# Patient Record
Sex: Female | Born: 1935 | Race: White | Hispanic: No
Health system: Southern US, Community
[De-identification: ages and names within clinical notes are randomized; demographics above are authoritative.]

## PROBLEM LIST (undated history)

## (undated) DIAGNOSIS — F0280 Dementia in other diseases classified elsewhere without behavioral disturbance: Secondary | ICD-10-CM

## (undated) DIAGNOSIS — M810 Age-related osteoporosis without current pathological fracture: Secondary | ICD-10-CM

## (undated) DIAGNOSIS — M069 Rheumatoid arthritis, unspecified: Secondary | ICD-10-CM

## (undated) DIAGNOSIS — I10 Essential (primary) hypertension: Secondary | ICD-10-CM

## (undated) DIAGNOSIS — L409 Psoriasis, unspecified: Secondary | ICD-10-CM

## (undated) DIAGNOSIS — G309 Alzheimer's disease, unspecified: Secondary | ICD-10-CM

## (undated) DIAGNOSIS — E785 Hyperlipidemia, unspecified: Secondary | ICD-10-CM

---

## 2004-06-23 ENCOUNTER — Ambulatory Visit: Payer: Self-pay

## 2004-07-23 ENCOUNTER — Ambulatory Visit: Payer: Self-pay | Admitting: Internal Medicine

## 2005-08-25 ENCOUNTER — Ambulatory Visit: Payer: Self-pay | Admitting: Internal Medicine

## 2008-12-18 ENCOUNTER — Ambulatory Visit: Payer: Self-pay | Admitting: Internal Medicine

## 2010-10-31 ENCOUNTER — Inpatient Hospital Stay: Payer: Self-pay | Admitting: Internal Medicine

## 2012-12-21 ENCOUNTER — Observation Stay: Payer: Self-pay | Admitting: Internal Medicine

## 2012-12-21 LAB — URINALYSIS, COMPLETE
Bacteria: NONE SEEN
Bilirubin,UR: NEGATIVE
Glucose,UR: NEGATIVE mg/dL (ref 0–75)
Ketone: NEGATIVE
Ph: 5 (ref 4.5–8.0)
RBC,UR: 3 /HPF (ref 0–5)
Specific Gravity: 1.015 (ref 1.003–1.030)
Squamous Epithelial: NONE SEEN
WBC UR: 1 /HPF (ref 0–5)

## 2012-12-21 LAB — CBC
HGB: 11.9 g/dL — ABNORMAL LOW (ref 12.0–16.0)
MCH: 30.9 pg (ref 26.0–34.0)
MCHC: 33.2 g/dL (ref 32.0–36.0)
Platelet: 141 10*3/uL — ABNORMAL LOW (ref 150–440)
RBC: 3.85 10*6/uL (ref 3.80–5.20)
WBC: 9.9 10*3/uL (ref 3.6–11.0)

## 2012-12-21 LAB — COMPREHENSIVE METABOLIC PANEL
Albumin: 3.1 g/dL — ABNORMAL LOW (ref 3.4–5.0)
BUN: 21 mg/dL — ABNORMAL HIGH (ref 7–18)
Bilirubin,Total: 0.4 mg/dL (ref 0.2–1.0)
Calcium, Total: 9 mg/dL (ref 8.5–10.1)
Chloride: 107 mmol/L (ref 98–107)
Co2: 26 mmol/L (ref 21–32)
Creatinine: 0.81 mg/dL (ref 0.60–1.30)
EGFR (African American): >60
Glucose: 102 mg/dL — ABNORMAL HIGH (ref 65–99)
Osmolality: 283 (ref 275–301)
Potassium: 3.6 mmol/L (ref 3.5–5.1)
Total Protein: 6.3 g/dL — ABNORMAL LOW (ref 6.4–8.2)

## 2012-12-21 LAB — LIPASE, BLOOD: Lipase: 209 U/L (ref 73–393)

## 2012-12-21 LAB — DIFFERENTIAL

## 2012-12-22 LAB — BASIC METABOLIC PANEL
Anion Gap: 6 — ABNORMAL LOW (ref 7–16)
BUN: 18 mg/dL (ref 7–18)
Co2: 27 mmol/L (ref 21–32)
Creatinine: 0.69 mg/dL (ref 0.60–1.30)
EGFR (African American): >60
Glucose: 93 mg/dL (ref 65–99)
Osmolality: 283 (ref 275–301)
Potassium: 3.5 mmol/L (ref 3.5–5.1)
Sodium: 141 mmol/L (ref 136–145)

## 2012-12-22 LAB — CBC WITH DIFFERENTIAL/PLATELET
Basophil #: 0 10*3/uL (ref 0.0–0.1)
Eosinophil #: 0 10*3/uL (ref 0.0–0.7)
Eosinophil %: 0.2 %
HCT: 33.2 % — ABNORMAL LOW (ref 35.0–47.0)
Lymphocyte #: 0.8 10*3/uL — ABNORMAL LOW (ref 1.0–3.6)
MCH: 31 pg (ref 26.0–34.0)
MCHC: 33 g/dL (ref 32.0–36.0)
Monocyte %: 6.7 %
Neutrophil %: 86.6 %
Platelet: 125 10*3/uL — ABNORMAL LOW (ref 150–440)
RBC: 3.54 10*6/uL — ABNORMAL LOW (ref 3.80–5.20)
WBC: 12.7 10*3/uL — ABNORMAL HIGH (ref 3.6–11.0)

## 2012-12-22 LAB — HEPATIC FUNCTION PANEL A (ARMC)
Alkaline Phosphatase: 60 U/L
Bilirubin, Direct: 0.1 mg/dL (ref 0.00–0.20)
Bilirubin,Total: 0.5 mg/dL (ref 0.2–1.0)
SGOT(AST): 17 U/L (ref 15–37)
Total Protein: 5.4 g/dL — ABNORMAL LOW (ref 6.4–8.2)

## 2012-12-23 LAB — CBC WITH DIFFERENTIAL/PLATELET
Basophil #: 0 10*3/uL (ref 0.0–0.1)
Basophil %: 0.4 %
Eosinophil #: 0 10*3/uL (ref 0.0–0.7)
Eosinophil %: 0.5 %
HGB: 10.7 g/dL — ABNORMAL LOW (ref 12.0–16.0)
Lymphocyte #: 1.1 10*3/uL (ref 1.0–3.6)
Lymphocyte %: 12.8 %
MCH: 31.9 pg (ref 26.0–34.0)
MCHC: 33.9 g/dL (ref 32.0–36.0)
Monocyte #: 0.8 x10 3/mm (ref 0.2–0.9)
Monocyte %: 8.5 %
Neutrophil #: 6.9 10*3/uL — ABNORMAL HIGH (ref 1.4–6.5)
Neutrophil %: 77.8 %
Platelet: 104 10*3/uL — ABNORMAL LOW (ref 150–440)
RBC: 3.35 10*6/uL — ABNORMAL LOW (ref 3.80–5.20)
RDW: 15 % — ABNORMAL HIGH (ref 11.5–14.5)
WBC: 8.9 10*3/uL (ref 3.6–11.0)

## 2012-12-23 LAB — URINE CULTURE

## 2012-12-26 LAB — CULTURE, BLOOD (SINGLE)

## 2013-05-11 ENCOUNTER — Emergency Department: Payer: Self-pay | Admitting: Internal Medicine

## 2013-05-11 LAB — URINALYSIS, COMPLETE
BLOOD: NEGATIVE
Bilirubin,UR: NEGATIVE
Glucose,UR: NEGATIVE mg/dL (ref 0–75)
KETONE: NEGATIVE
NITRITE: NEGATIVE
PROTEIN: NEGATIVE
Ph: 7 (ref 4.5–8.0)
RBC,UR: NONE SEEN /HPF (ref 0–5)
SPECIFIC GRAVITY: 1.014 (ref 1.003–1.030)
Squamous Epithelial: NONE SEEN
WBC UR: 5 /HPF (ref 0–5)

## 2013-05-11 LAB — COMPREHENSIVE METABOLIC PANEL
ALBUMIN: 3.3 g/dL — AB (ref 3.4–5.0)
Alkaline Phosphatase: 78 U/L
Anion Gap: 3 — ABNORMAL LOW (ref 7–16)
BUN: 12 mg/dL (ref 7–18)
Bilirubin,Total: 0.5 mg/dL (ref 0.2–1.0)
CREATININE: 0.94 mg/dL (ref 0.60–1.30)
Calcium, Total: 9.2 mg/dL (ref 8.5–10.1)
Chloride: 105 mmol/L (ref 98–107)
Co2: 32 mmol/L (ref 21–32)
EGFR (African American): >60
GFR CALC NON AF AMER: 52 — AB
Glucose: 155 mg/dL — ABNORMAL HIGH (ref 65–99)
Osmolality: 282 (ref 275–301)
Potassium: 3.5 mmol/L (ref 3.5–5.1)
SGOT(AST): 15 U/L (ref 15–37)
SGPT (ALT): 12 U/L (ref 12–78)
Sodium: 140 mmol/L (ref 136–145)
TOTAL PROTEIN: 6.8 g/dL (ref 6.4–8.2)

## 2013-05-11 LAB — CBC
HCT: 40 % (ref 35.0–47.0)
HGB: 13.2 g/dL (ref 12.0–16.0)
MCH: 29.9 pg (ref 26.0–34.0)
MCHC: 33 g/dL (ref 32.0–36.0)
MCV: 91 fL (ref 80–100)
Platelet: 167 10*3/uL (ref 150–440)
RBC: 4.42 10*6/uL (ref 3.80–5.20)
RDW: 14.5 % (ref 11.5–14.5)
WBC: 9 10*3/uL (ref 3.6–11.0)

## 2013-07-18 ENCOUNTER — Emergency Department: Payer: Self-pay | Admitting: Emergency Medicine

## 2013-08-05 ENCOUNTER — Emergency Department: Payer: Self-pay | Admitting: Emergency Medicine

## 2013-09-09 ENCOUNTER — Emergency Department: Payer: Self-pay | Admitting: Emergency Medicine

## 2013-09-13 ENCOUNTER — Emergency Department: Payer: Self-pay | Admitting: Emergency Medicine

## 2013-10-03 ENCOUNTER — Emergency Department: Payer: Self-pay | Admitting: Emergency Medicine

## 2013-10-06 ENCOUNTER — Emergency Department: Payer: Self-pay | Admitting: Emergency Medicine

## 2013-10-06 LAB — CBC
HCT: 38.4 % (ref 35.0–47.0)
HGB: 12.6 g/dL (ref 12.0–16.0)
MCH: 29 pg (ref 26.0–34.0)
MCHC: 32.9 g/dL (ref 32.0–36.0)
MCV: 88 fL (ref 80–100)
Platelet: 186 10*3/uL (ref 150–440)
RBC: 4.35 10*6/uL (ref 3.80–5.20)
RDW: 14.3 % (ref 11.5–14.5)
WBC: 9 10*3/uL (ref 3.6–11.0)

## 2013-10-06 LAB — URINALYSIS, COMPLETE
BLOOD: NEGATIVE
Bilirubin,UR: NEGATIVE
Glucose,UR: NEGATIVE mg/dL (ref 0–75)
KETONE: NEGATIVE
Nitrite: NEGATIVE
PH: 5 (ref 4.5–8.0)
PROTEIN: NEGATIVE
Specific Gravity: 1.018 (ref 1.003–1.030)
WBC UR: 88 /HPF (ref 0–5)

## 2013-10-06 LAB — BASIC METABOLIC PANEL
ANION GAP: 7 (ref 7–16)
BUN: 16 mg/dL (ref 7–18)
Calcium, Total: 8.8 mg/dL (ref 8.5–10.1)
Chloride: 104 mmol/L (ref 98–107)
Co2: 30 mmol/L (ref 21–32)
Creatinine: 0.86 mg/dL (ref 0.60–1.30)
EGFR (African American): >60
EGFR (Non-African Amer.): 58 — ABNORMAL LOW
Glucose: 129 mg/dL — ABNORMAL HIGH (ref 65–99)
OSMOLALITY: 284 (ref 275–301)
Potassium: 3.2 mmol/L — ABNORMAL LOW (ref 3.5–5.1)
Sodium: 141 mmol/L (ref 136–145)

## 2013-10-06 LAB — TROPONIN I: Troponin-I: <0.02 ng/mL

## 2013-10-10 LAB — URINE CULTURE

## 2013-10-27 ENCOUNTER — Emergency Department: Payer: Self-pay | Admitting: Student

## 2013-10-27 LAB — COMPREHENSIVE METABOLIC PANEL
ALBUMIN: 3.1 g/dL — AB (ref 3.4–5.0)
ALK PHOS: 154 U/L — AB
AST: 32 U/L (ref 15–37)
Anion Gap: 8 (ref 7–16)
BILIRUBIN TOTAL: 0.7 mg/dL (ref 0.2–1.0)
BUN: 12 mg/dL (ref 7–18)
CO2: 29 mmol/L (ref 21–32)
CREATININE: 0.65 mg/dL (ref 0.60–1.30)
Calcium, Total: 8.8 mg/dL (ref 8.5–10.1)
Chloride: 109 mmol/L — ABNORMAL HIGH (ref 98–107)
EGFR (African American): >60
EGFR (Non-African Amer.): >60
GLUCOSE: 88 mg/dL (ref 65–99)
OSMOLALITY: 290 (ref 275–301)
POTASSIUM: 4.2 mmol/L (ref 3.5–5.1)
SGPT (ALT): 13 U/L — ABNORMAL LOW
SODIUM: 146 mmol/L — AB (ref 136–145)
Total Protein: 6.7 g/dL (ref 6.4–8.2)

## 2013-10-27 LAB — CBC
HCT: 36.9 % (ref 35.0–47.0)
HGB: 11.7 g/dL — ABNORMAL LOW (ref 12.0–16.0)
MCH: 28.3 pg (ref 26.0–34.0)
MCHC: 31.8 g/dL — ABNORMAL LOW (ref 32.0–36.0)
MCV: 89 fL (ref 80–100)
Platelet: 205 10*3/uL (ref 150–440)
RBC: 4.14 10*6/uL (ref 3.80–5.20)
RDW: 15.2 % — AB (ref 11.5–14.5)
WBC: 7.3 10*3/uL (ref 3.6–11.0)

## 2013-10-27 LAB — TROPONIN I: Troponin-I: <0.02 ng/mL

## 2013-10-27 LAB — APTT: Activated PTT: 36.1 secs — ABNORMAL HIGH (ref 23.6–35.9)

## 2013-10-27 LAB — PROTIME-INR
INR: 1.1
Prothrombin Time: 14 secs (ref 11.5–14.7)

## 2013-10-28 LAB — URINALYSIS, COMPLETE
BACTERIA: NONE SEEN
BILIRUBIN, UR: NEGATIVE
BLOOD: NEGATIVE
Glucose,UR: NEGATIVE mg/dL (ref 0–75)
Nitrite: NEGATIVE
PROTEIN: NEGATIVE
Ph: 7 (ref 4.5–8.0)
Specific Gravity: 1.017 (ref 1.003–1.030)
Squamous Epithelial: <1
WBC UR: 50 /HPF (ref 0–5)

## 2013-10-30 LAB — URINE CULTURE

## 2014-01-04 ENCOUNTER — Emergency Department: Payer: Self-pay | Admitting: Emergency Medicine

## 2014-04-21 ENCOUNTER — Emergency Department
Admit: 2014-04-21 | Disposition: A | Discharge status: Skilled Nursing Facility | Payer: Self-pay | Admitting: Emergency Medicine

## 2014-04-21 LAB — URINALYSIS, COMPLETE
Bilirubin,UR: NEGATIVE
Blood: NEGATIVE
Glucose,UR: NEGATIVE mg/dL (ref 0–75)
KETONE: NEGATIVE
Leukocyte Esterase: NEGATIVE
Nitrite: POSITIVE
PH: 7 (ref 4.5–8.0)
Protein: NEGATIVE
RBC,UR: 1 /HPF (ref 0–5)
Specific Gravity: 1.031 (ref 1.003–1.030)

## 2014-04-21 LAB — COMPREHENSIVE METABOLIC PANEL
ALT: 12 U/L — AB
Albumin: 4.5 g/dL
Alkaline Phosphatase: 74 U/L
Anion Gap: 8 (ref 7–16)
BUN: 18 mg/dL
Bilirubin,Total: 1 mg/dL
Calcium, Total: 9.5 mg/dL
Chloride: 103 mmol/L
Co2: 29 mmol/L
Creatinine: 0.74 mg/dL
EGFR (African American): >60
GLUCOSE: 143 mg/dL — AB
Potassium: 3.5 mmol/L
SGOT(AST): 26 U/L
Sodium: 140 mmol/L
Total Protein: 8.1 g/dL

## 2014-04-21 LAB — CBC WITH DIFFERENTIAL/PLATELET
BASOS ABS: 0 10*3/uL (ref 0.0–0.1)
Basophil %: 0.2 %
Eosinophil #: 0 10*3/uL (ref 0.0–0.7)
Eosinophil %: 0.2 %
HCT: 45.7 % (ref 35.0–47.0)
HGB: 15.2 g/dL (ref 12.0–16.0)
Lymphocyte #: 0.2 10*3/uL — ABNORMAL LOW (ref 1.0–3.6)
Lymphocyte %: 2.2 %
MCH: 30.2 pg (ref 26.0–34.0)
MCHC: 33.2 g/dL (ref 32.0–36.0)
MCV: 91 fL (ref 80–100)
MONO ABS: 0.3 x10 3/mm (ref 0.2–0.9)
MONOS PCT: 3.3 %
Neutrophil #: 8.5 10*3/uL — ABNORMAL HIGH (ref 1.4–6.5)
Neutrophil %: 94.1 %
PLATELETS: 159 10*3/uL (ref 150–440)
RBC: 5.04 10*6/uL (ref 3.80–5.20)
RDW: 14.6 % — ABNORMAL HIGH (ref 11.5–14.5)
WBC: 9 10*3/uL (ref 3.6–11.0)

## 2014-04-21 LAB — LACTIC ACID, PLASMA: Lactic Acid, Venous: 1.4 mmol/L

## 2014-04-21 LAB — TROPONIN I

## 2014-04-23 LAB — URINE CULTURE

## 2014-05-11 NOTE — H&P (Signed)
PATIENT NAME:  Amanda Watkins, Amanda Watkins MR#:  [id number] DATE OF BIRTH:  [birth year]  DATE OF ADMISSION:  12/21/2012  CHIEF COMPLAINT: Fevers with altered mental status.   HISTORY OF PRESENT ILLNESS: A 81 year old female with history of Alzheimer-type dementia, COPD, hypertension, rheumatoid arthritis, chronic methotrexate therapy. Had been in her usual state of health until earlier today when she had sudden onset and development of fever up to at least 102. She became more confused and vomited twice by report. She was transferred to the Emergency Room where evaluation revealed temperature of 102.6. She developed relative hypotension, systolic blood pressure of 97. The patient is clearly more confused than her baseline, however, is able to answer questions. She denies pain, headache, vision changes, rash  or any bowel or bladder issues. She does not recall vomiting. Her examination reveals minimal epigastric discomfort. Labs reveal evidence of dehydration. Influenza A and B antigen negative. She will be brought in under observation now for severe fever with hypotension and dehydration in an immunocompromised patient given her use of methotrexate, as well as worsening mental status.   PAST MEDICAL HISTORY: 1.  Rheumatoid arthritis on methotrexate.  2.  Hyperlipidemia.  3.  Hypertension.  4.  COPD with bronchiectasis.  5.  Psoriasis.  6.  Gastroesophageal reflux disease.  7.  Osteoporosis with vitamin D deficiency.  8.  Alzheimer-type dementia.  9.  History of wrist fracture, right side, October 2012.  10.  Status post mastoidectomy.  11.  Status post hysterectomy.  12.  Tonsillectomy.  13.  Status post appendectomy.  14.  History of right hand surgery x 2.   ALLERGIES: ACTONEL, ALENDRONATE, BONIVA, LEVAQUIN, LOVASTATIN AND PENICILLIN.   MEDICATIONS: 1.  Calcium 600 mg p.o. daily.  2.  Pravachol 20 mg p.o. at bedtime.  3.  Vitamin C 500 mg daily. 4.  Verapamil XR 240 mg p.o. 5.  Losartan 50 mg  p.o. daily. 6.  Zofran 4 mg ODT by mouth every 6 to 8 hours as needed for nausea or vomiting.  7.  Aricept 10 mg p.o. daily.  8.  Lasix 20 mg p.o. daily.  9.  Astelin 1 spray each nostril b.i.d. as needed for nasal congestion.  10.  Folate 1 mg p.o. daily.  11.  Vitamin D 5000 units p.o. q. week.  12.  Methotrexate 12.5 mg p.o. q. week.   SOCIAL HISTORY: No alcohol or tobacco.   FAMILY HISTORY: Bronchiectasis, rheumatoid arthritis.   REVIEW OF SYSTEMS:  Please see HPI. Extremely limited secondary to dementia. Discussed with her son, he does not recall any major medical issues more recently. Remainder of complete review of systems is thus negative.   PHYSICAL EXAMINATION: VITAL SIGNS: Initial temperature 102.6, now 99.2. Blood pressure 97/76, pulse 96.  GENERAL: Elderly female, confused, but in no acute distress.  EYES: Pupils postoperative. Lids and conjunctivae unremarkable. No photophobia is appreciated.  EARS, NOSE AND THROAT: External examination unremarkable. The oropharynx is dry without lesions. Bilateral TMs are obscured by cerumen.  NECK: Supple, trachea midline. Able to put chin to chest without issue.  CARDIOVASCULAR: Regular rate and rhythm without gallops, rubs. Carotid and radial pulses 2+.  LUNGS: Basilar crackles without wheeze, retractions. Saturation 95% room air.  ABDOMEN: Soft, nontender, quiet bowel sounds. Mild tenderness to deep palpation in the epigastric region without guard, rebound.  SKIN: No significant rashes or nodules.  LYMPH NODES: No cervical or supraclavicular nodes.  MUSCULOSKELETAL: No clubbing or cyanosis. 1+ edema in the feet, most consistent  with lymphedema. Negative Homans.  NEUROLOGIC: Cranial nerves appear to be grossly intact. Motor strength appears to be symmetrical. Short-term memory is poor. The patient is more confused than baseline.   DATA: Influenza A and B negative per the lab. CT head without acute changes. Chest x-ray with increased  interstitial markings consistent with fibrosis. Urinalysis with 3 red cells, 1 white cell. Cardiac enzymes negative. Liver enzymes unremarkable. BUN 21, creatinine 0.8, glucose 102. White count 9.9, hematocrit 36, platelets 141, without a differential.   IMPRESSION AND PLAN: 1.  Fevers/altered mental status/dehydration. Place in observation, add IV fluids. The story would be most consistent with influenza given the rapid onset, high fevers, really no other symptoms at this point. We will follow her cultures. Hold on antibiotics as no clear source. Hold methotrexate at this time.  2.  Altered mental status, appears to have some metabolic encephalopathy likely due to acute illness in the face of her underlying dementia. Follow for now.  3.  Hypertension. The patient currently hypotensive. Holding all blood pressure medications and following.   CODE STATUS: The patient has an outpatient DNR order and the DNR status will remain in effect.    ____________________________ Lynnea Ferrier, MD bjk:dmm D: 12/21/2012 18:54:55 ET T: 12/21/2012 19:04:52 ET JOB#: [id number]  cc: Curtis Sites III, MD, <Dictator> Daniel Nones MD ELECTRONICALLY SIGNED 01/02/2013 22:15

## 2014-05-11 NOTE — Discharge Summary (Signed)
PATIENT NAME:  Amanda Watkins, Amanda Watkins MR#:  [id number] DATE OF BIRTH:  1918-09-09  DATE OF ADMISSION:  12/21/2012 DATE OF DISCHARGE:  12/23/2012   FINAL DIAGNOSES:  1. Dehydration.  2. Fever, etiology unclear, likely viral syndrome.  3. Alzheimer's type dementia.  4. Chronic obstructive pulmonary disease, mild.  5. Hypertension.  6. Rheumatoid arthritis, on chronic methotrexate therapy.  7. Anemia/thrombocytopenia, thought to be secondary to viral syndrome.   HISTORY AND PHYSICAL: Please see dictated admission history and physical.   SUMMARY OF HOSPITAL COURSE: The patient was admitted with onset of high fevers over 102 with confusion and with nausea, vomiting. She was noted to have evidence of dehydration on arrival and was placed on IV fluids. Blood and urine cultures were obtained, which remained negative throughout her hospitalization. Chest x-ray was unremarkable. A flu swab was obtained, which was negative. She had no further high fevers, temperatures remaining around 99 to 100 maximally, and the patient with relatively no symptoms with this. She did feel a bit more weak than usual, though she was able to ambulate holding onto the IV pole independently. Her confusion improved significantly; it was thought that it likely represented some metabolic encephalopathy related to her dementia with fever.   Family members did come up to see her, and they felt like her mental status had also returned to normal, though they acknowledged that she seemed to be somewhat weak. Her oral intake has not been great, but is improving, and at this point, it is felt that she should be able to go back to the assisted living facility where she resides. This was discussed with family members, and they are comfortable with this plan. She should be up with a walker as tolerated, and will ask home health physical therapy to see the patient to improve her ambulation and endurance. She will follow a bland diet for the next 4  days, then a no added salt diet. She will follow up in our office in the next 7 to 10 days.   DISCHARGE MEDICATIONS:  1. Folate 1 mg p.o. daily.  2. Calcium 600 mg p.o. daily.  3. Vitamin D 2000 units p.o. daily.  4. Aricept 10 mg p.o. at bedtime.  5. Pravastatin 20 mg p.o. at bedtime.  6. Azelastine 0.137 mcg 1 spray to each nostril b.i.d. as needed for rhinitis.  8. Mucinex 600 mg p.o. b.i.d. as needed for congestion.  9. Ondansetron 4 mg p.o. q.6 hours as needed for nausea or vomiting.  10. Pantoprazole 40 mg p.o. daily.  All of her blood pressure medications, including verapamil, losartan and furosemide, were held during her hospitalization as her blood pressure was relatively low, and her systolic blood pressure is 116 at this time. She was not significantly orthostatic. Her methotrexate initially will be held until her followup to make sure that the viral syndrome clears.   Discussion was held with family members that we cannot exclude any other infectious source as the cause of the above; however, her white blood cell count, which peaked at 12.7, has returned to normal. They recognize this possibility, but that it does appear that the worst has passed and are comfortable with her leaving.    CODE STATUS: The patient is do not resuscitate, and she has an out-of-facility DNR form.  ____________________________ Lynnea Ferrier, MD bjk:lb D: 12/23/2012 08:14:11 ET T: 12/23/2012 08:43:21 ET JOB#: [id number]  cc: Curtis Sites III, MD, <Dictator> Daniel Nones MD ELECTRONICALLY SIGNED 01/02/2013 22:15

## 2014-05-14 ENCOUNTER — Emergency Department
Admit: 2014-05-14 | Disposition: A | Discharge status: Home / Self Care | Payer: Self-pay | Admitting: Emergency Medicine

## 2014-09-22 ENCOUNTER — Encounter: Payer: Self-pay | Admitting: Emergency Medicine

## 2014-09-22 ENCOUNTER — Emergency Department
Admission: EM | Admit: 2014-09-22 | Discharge: 2014-09-22 | Disposition: A | Discharge status: Home / Self Care | Payer: Medicare HMO | Attending: Emergency Medicine | Admitting: Emergency Medicine

## 2014-09-22 DIAGNOSIS — I10 Essential (primary) hypertension: Secondary | ICD-10-CM | POA: Diagnosis not present

## 2014-09-22 DIAGNOSIS — R040 Epistaxis: Secondary | ICD-10-CM | POA: Insufficient documentation

## 2014-09-22 HISTORY — DX: Essential (primary) hypertension: I10

## 2014-09-22 LAB — CBC WITH DIFFERENTIAL/PLATELET
Basophils Absolute: 0 10*3/uL (ref 0–0.1)
Basophils Relative: 1 %
Eosinophils Absolute: 0.1 10*3/uL (ref 0–0.7)
Eosinophils Relative: 1 %
HEMATOCRIT: 41.1 % (ref 35.0–47.0)
Hemoglobin: 13.7 g/dL (ref 12.0–16.0)
Lymphocytes Relative: 17 %
Lymphs Abs: 1.6 10*3/uL (ref 1.0–3.6)
MCH: 30.1 pg (ref 26.0–34.0)
MCHC: 33.3 g/dL (ref 32.0–36.0)
MCV: 90.2 fL (ref 80.0–100.0)
MONO ABS: 0.6 10*3/uL (ref 0.2–0.9)
Monocytes Relative: 6 %
NEUTROS ABS: 7 10*3/uL — AB (ref 1.4–6.5)
Neutrophils Relative %: 75 %
Platelets: 196 10*3/uL (ref 150–440)
RBC: 4.56 MIL/uL (ref 3.80–5.20)
RDW: 14 % (ref 11.5–14.5)
WBC: 9.3 10*3/uL (ref 3.6–11.0)

## 2014-09-22 LAB — BASIC METABOLIC PANEL
Anion gap: 8 (ref 5–15)
BUN: 19 mg/dL (ref 6–20)
CHLORIDE: 106 mmol/L (ref 101–111)
CO2: 27 mmol/L (ref 22–32)
Calcium: 9.2 mg/dL (ref 8.9–10.3)
Creatinine, Ser: 0.62 mg/dL (ref 0.44–1.00)
GFR calc Af Amer: >60 mL/min (ref >60)
GFR calc non Af Amer: >60 mL/min (ref >60)
GLUCOSE: 115 mg/dL — AB (ref 65–99)
Potassium: 3.8 mmol/L (ref 3.5–5.1)
Sodium: 141 mmol/L (ref 135–145)

## 2014-09-22 MED ORDER — MIDAZOLAM HCL 2 MG/2ML IJ SOLN
1.0000 mg | Freq: Once | INTRAMUSCULAR | Status: AC
  Administered 2014-09-22: 1 mg via INTRAVENOUS

## 2014-09-22 MED ORDER — LIDOCAINE HCL (CARDIAC) 20 MG/ML IV SOLN
100.0000 mg | Freq: Once | INTRAVENOUS | Status: AC
  Administered 2014-09-22: 100 mg via INTRAVENOUS

## 2014-09-22 MED ORDER — MIDAZOLAM HCL 5 MG/5ML IJ SOLN
INTRAMUSCULAR | Status: AC
  Administered 2014-09-22: 1 mg via INTRAVENOUS
  Filled 2014-09-22: qty 5

## 2014-09-22 MED ORDER — CEPHALEXIN 500 MG PO CAPS
500.0000 mg | ORAL_CAPSULE | Freq: Once | ORAL | Status: AC
  Administered 2014-09-22: 500 mg via ORAL
  Filled 2014-09-22: qty 1

## 2014-09-22 MED ORDER — LIDOCAINE HCL (CARDIAC) 20 MG/ML IV SOLN
INTRAVENOUS | Status: AC
  Administered 2014-09-22: 100 mg via INTRAVENOUS
  Filled 2014-09-22: qty 5

## 2014-09-22 MED ORDER — LIDOCAINE HCL 4 % EX SOLN
CUTANEOUS | Status: AC
  Filled 2014-09-22: qty 50

## 2014-09-22 MED ORDER — BACITRACIN ZINC 500 UNIT/GM EX OINT
TOPICAL_OINTMENT | CUTANEOUS | Status: AC
  Filled 2014-09-22: qty 0.9

## 2014-09-22 MED ORDER — SILVER NITRATE-POT NITRATE 75-25 % EX MISC
CUTANEOUS | Status: DC
Start: 2014-09-22 — End: 2014-09-22
  Filled 2014-09-22: qty 2

## 2014-09-22 MED ORDER — PHENYLEPHRINE HCL 0.5 % NA SOLN
NASAL | Status: AC
  Filled 2014-09-22: qty 15

## 2014-09-22 NOTE — Discharge Instructions (Signed)
Nosebleed °A nosebleed can be caused by many things, including: °· Getting hit hard in the nose. °· Infections. °· Dry nose. °· Colds. °· Medicines. °Your doctor may do lab testing if you get nosebleeds a lot and the cause is not known. °HOME CARE  °· If your nose was packed with material, keep it there until your doctor takes it out. Put the pack back in your nose if the pack falls out. °· Do not blow your nose for 12 hours after the nosebleed. °· Sit up and bend forward if your nose starts bleeding again. Pinch the front half of your nose nonstop for 20 minutes. °· Put petroleum jelly inside your nose every morning if you have a dry nose. °· Use a humidifier to make the air less dry. °· Do not take aspirin. °· Try not to strain, lift, or bend at the waist for many days after the nosebleed. °GET HELP RIGHT AWAY IF:  °· Nosebleeds keep happening and are hard to stop or control. °· You have bleeding or bruises that are not normal on other parts of the body. °· You have a fever. °· The nosebleeds get worse. °· You get lightheaded, feel faint, sweaty, or throw up (vomit) blood. °MAKE SURE YOU:  °· Understand these instructions. °· Will watch your condition. °· Will get help right away if you are not doing well or get worse. °Document Released: 10/15/2007 Document Revised: 03/30/2011 Document Reviewed: 10/15/2007 °ExitCare® Patient Information ©2015 [employer]. This information is not intended to replace advice given to you by your health care provider. Make sure you discuss any questions you have with your health care provider. ° °

## 2014-09-22 NOTE — ED Notes (Signed)
Pt. Here via EMS for nose bleed from [facility].  Pt. States "I was blowing my nose and it started bleeding".

## 2014-09-22 NOTE — Consult Note (Signed)
Amanda Watkins, Amanda Watkins [id number] 01-03-1919 Amanda Watkins,*  Reason for Consult: Uncontrolled epistaxis  HPI: Patient is a 81 year old white female who has severe dementia and is in a memory unit at a nursing home. She has not had evidence of nose was previously started with acute epistaxis from her right nostril this morning. She had a rhino Rhino Rocket placed with some control bleeding but she is pulled it out twice. She continues to want to mess with that and has mittens on her hand to help prevent her from pulled out again. Assessment made of her nose see if we can find a active bleeding site that can be cauterized to control. She's had some blood pressure issues but her blood pressure is running about 165/90 right now. She is not actively bleeding with the [employer] and currently.  Allergies: No Known Allergies  ROS: Review of systems normal other than 12 systems except per HPI.  PMH:  Past Medical History  Diagnosis Date  . Hypertension     FH: No family history on file.  SH:  Social History   Social History  . Marital Status: Widowed    Spouse Name: N/A  . Number of Children: N/A  . Years of Education: N/A   Occupational History  . Not on file.   Social History Main Topics  . Smoking status: Not on file  . Smokeless tobacco: Not on file  . Alcohol Use: Not on file  . Drug Use: Not on file  . Sexual Activity: Not on file   Other Topics Concern  . Not on file   Social History Narrative  . No narrative on file    PSH: No past surgical history on file.  Physical  Exam: CN 2-12 grossly intact and symmetric.  Oral cavity, lips, gums, ororpharynx normal with no masses or lesions. No evidence of bleeding coming down the back of her throat. Skin warm and dry. Nasal cavity has a Rhino Rocket in the right side but the left side is open and clear. External nose and ears without masses or lesions. EOMI, PERRLA. Neck supple with no masses or lesions. No  lymphadenopathy palpated. Thyroid normal with no masses.  The [employer] was removed and there was no active bleeding noted currently. There was a vessel sticking out in the anterior septum on the right side. This was gently rubbed with silver nitrate cautery and immediately started actively bleeding. This was cauterized with silver nitrate and dry cotton tips to create a silver nitrate patch over the vessel. This controlled bleeding well. No other bleeding sites noted. Her nostrils wide open and clear with good open airway. No packing was placed because she had no further bleeding.   A/P: Epistaxis the right side of her nose. This appears to be from an anterior vessel that is now cauterized and controlled. Think she can be discharged back to the nursing home to rest and take it easy and she should not have further bleeding. Need to monitor her blood pressure to make sure it remains controlled. Follow-up if further bleeding.   Amanda Watkins H 09/22/2014 11:40 AM

## 2014-09-22 NOTE — ED Notes (Signed)
MD at bedside, safety sitter at bedside

## 2014-09-22 NOTE — ED Notes (Signed)
Resting with sitter at bedside, nasal packing in and nasal clamp on.

## 2014-09-22 NOTE — ED Notes (Signed)
Pt awaiting EMS transport pickup

## 2014-09-22 NOTE — ED Provider Notes (Signed)
[facility] Emergency Department Provider Note  ____________________________________________  Time seen: Approximately 705 AM  I have reviewed the triage vital signs and the nursing notes.   HISTORY  Chief Complaint Epistaxis    HPI Amanda Watkins is a 81 y.o. female with a history of epistaxis who is presenting today with epistaxis since 35 AM.  The staff the patient began having a nosebleed about an hour ago. She was sent to the hospital about one year ago for similar presentation. Patient says that she has had some mild rhinorrhea lately and was lying her nose and the bleeding started. She denies any pain at this time. Head held some pressure with a tissue prior to arrival. Afrin was tried by the ambulance crew which did not resolve the bleeding. Bleeding was noted out of the right naris.Patient's only anticoagulant is aspirin.   Past Medical History  Diagnosis Date  . Hypertension     There are no active problems to display for this patient.   No past surgical history on file.  No current outpatient prescriptions on file.  Allergies Review of patient's allergies indicates no known allergies.  No family history on file.  Social History Social History  Substance Use Topics  . Smoking status: Not on file  . Smokeless tobacco: Not on file  . Alcohol Use: Not on file    Review of Systems Constitutional: No fever/chills Eyes: No visual changes. ENT: No sore throat. Cardiovascular: Denies chest pain. Respiratory: Denies shortness of breath. Gastrointestinal: No abdominal pain.  No nausea, no vomiting.  No diarrhea.  No constipation. Genitourinary: Negative for dysuria. Musculoskeletal: Negative for back pain. Skin: Negative for rash. Neurological: Negative for headaches, focal weakness or numbness.  10-point ROS otherwise negative.  ____________________________________________   PHYSICAL EXAM:  VITAL SIGNS: ED Triage Vitals   Enc Vitals Group     BP 09/22/14 0703 186/84 mmHg     Pulse Rate 09/22/14 0703 75     Resp 09/22/14 0703 20     Temp 09/22/14 0703 98.2 F (36.8 C)     Temp Source 09/22/14 0703 Oral     SpO2 09/22/14 0703 100 %     Weight 09/22/14 0703 120 lb (54.432 kg)     Height 09/22/14 0703 5\' 2"  (1.575 m)     Head Cir --      Peak Flow --      Pain Score 09/22/14 0704 0     Pain Loc --      Pain Edu? --      Excl. in GC? --     Constitutional: Alert and oriented. Well appearing and in no acute distress. Eyes: Conjunctivae are normal. PERRL. EOMI. Head: Atraumatic. Nose: Patient with mild amount of crusted blood around the right naris entrance. No active bleeding at this time. However, patient is wearing nasal clamps. There is no blood visualized in the pharynx. The patient is maintaining her airway and is talking with a normal tone and voice. Mouth/Throat: Mucous membranes are moist.  Oropharynx non-erythematous. Neck: No stridor.   Cardiovascular: Normal rate, regular rhythm. Grossly normal heart sounds.  Good peripheral circulation. Respiratory: Normal respiratory effort.  No retractions. Lungs CTAB. Gastrointestinal: Soft and nontender. No distention. No abdominal bruits. No CVA tenderness. Musculoskeletal: No lower extremity tenderness nor edema.  No joint effusions. Neurologic:  Normal speech and language. No gross focal neurologic deficits are appreciated. No gait instability. Skin:  Skin is warm, dry and intact. No rash noted.  Psychiatric: Mood and affect are normal. Speech and behavior are normal.  ____________________________________________   LABS (all labs ordered are listed, but only abnormal results are displayed)  Labs Reviewed - No data to display ____________________________________________  EKG   ____________________________________________  RADIOLOGY   ____________________________________________   PROCEDURES  Rapid Rhino insertion. Rapid Rhino was  soaked in sterile water for 30 seconds and then placed into the right naris which was examined prior to the insertion.  He insertion and examination revealed blood diffusely. I cannot identify a exact source of bleeding but the bleeding did appear to be anterior. There was a large amount of bright red blood covering the anterior nasal septum to the right naris. There was no pulsatile bleeding. Rapid Rhino was inserted after the sterile water soaked and was inflated with about 10 cc of air. The pressure balloon was not tense and was easily compressible with a light squeeze. The bleeding was stopped and there was no further bleeding around the rapid Rhino.  ____________________________________________   INITIAL IMPRESSION / ASSESSMENT AND PLAN / ED COURSE  Pertinent labs & imaging results that were available during my care of the patient were reviewed by me and considered in my medical decision making (see chart for details).  ----------------------------------------- 8:03 AM on 09/22/2014 -----------------------------------------  Nasal clamp was removed and patient began to have constant dripping from the right naris which progressed to clots. The patient was found coughing up several large blood clots. At this point after the patient has received Afrin as well as pressure. The patient had elevated blood pressure and I did not want to proceed with Neo-Synephrine to further elevate this. I inserted a Rhino Rocket which stopped the bleeding.  ----------------------------------------- 11:49 AM on 09/22/2014 -----------------------------------------  Patient was agitated with Rhino rocket and pulled out. I reinserted using anxiolysis with 1 mg of Versed. The patient tolerated this well and she was placed in mits with a one-on-one.The Rhino rocket stabilize the bleeding. However, this does present a disposition issue because the patient when shortly pull it out and continue the bleeding without  close supervision. I discussed the case with Victorino Dike, the patient's nurse at her memory care unit who said that they cannot provide a one-to-one care today and the patient's family is in [place]. Ear nose and throat was called and Dr. Genevive Bi came in to evaluate the patient.   Dr. Genevive Bi removed the [facility] and at this point there was no further bleeding. He was able to visualize an anterior vessel that he was able to cauterize. The patient was then observed and no bleeding recurred. The patient will be discharged back to her memory care unit. ____________________________________________   FINAL CLINICAL IMPRESSION(S) / ED DIAGNOSES  Acute anterior epistaxis. Initial visit.    [name], MD 09/22/14 [phone]

## 2015-05-19 ENCOUNTER — Emergency Department: Payer: Medicare HMO

## 2015-05-19 ENCOUNTER — Emergency Department
Admission: EM | Admit: 2015-05-19 | Discharge: 2015-05-19 | Disposition: A | Discharge status: Skilled Nursing Facility | Payer: Medicare HMO | Attending: Emergency Medicine | Admitting: Emergency Medicine

## 2015-05-19 DIAGNOSIS — Z7982 Long term (current) use of aspirin: Secondary | ICD-10-CM | POA: Insufficient documentation

## 2015-05-19 DIAGNOSIS — Y92239 Unspecified place in hospital as the place of occurrence of the external cause: Secondary | ICD-10-CM | POA: Diagnosis not present

## 2015-05-19 DIAGNOSIS — W06XXXA Fall from bed, initial encounter: Secondary | ICD-10-CM | POA: Insufficient documentation

## 2015-05-19 DIAGNOSIS — N390 Urinary tract infection, site not specified: Secondary | ICD-10-CM | POA: Diagnosis not present

## 2015-05-19 DIAGNOSIS — Z791 Long term (current) use of non-steroidal anti-inflammatories (NSAID): Secondary | ICD-10-CM | POA: Insufficient documentation

## 2015-05-19 DIAGNOSIS — I10 Essential (primary) hypertension: Secondary | ICD-10-CM | POA: Diagnosis not present

## 2015-05-19 DIAGNOSIS — Y999 Unspecified external cause status: Secondary | ICD-10-CM | POA: Diagnosis not present

## 2015-05-19 DIAGNOSIS — Z79899 Other long term (current) drug therapy: Secondary | ICD-10-CM | POA: Diagnosis not present

## 2015-05-19 DIAGNOSIS — M25551 Pain in right hip: Secondary | ICD-10-CM | POA: Diagnosis present

## 2015-05-19 DIAGNOSIS — W19XXXA Unspecified fall, initial encounter: Secondary | ICD-10-CM

## 2015-05-19 DIAGNOSIS — Y939 Activity, unspecified: Secondary | ICD-10-CM | POA: Diagnosis not present

## 2015-05-19 LAB — URINALYSIS COMPLETE WITH MICROSCOPIC (ARMC ONLY)
BILIRUBIN URINE: NEGATIVE
Glucose, UA: NEGATIVE mg/dL
HGB URINE DIPSTICK: NEGATIVE
KETONES UR: NEGATIVE mg/dL
LEUKOCYTES UA: NEGATIVE
NITRITE: POSITIVE — AB
PH: 7 (ref 5.0–8.0)
PROTEIN: NEGATIVE mg/dL
SQUAMOUS EPITHELIAL / LPF: NONE SEEN
Specific Gravity, Urine: 1.011 (ref 1.005–1.030)

## 2015-05-19 LAB — GLUCOSE, CAPILLARY: Glucose-Capillary: 103 mg/dL — ABNORMAL HIGH (ref 65–99)

## 2015-05-19 MED ORDER — ACETAMINOPHEN 325 MG PO TABS
650.0000 mg | ORAL_TABLET | ORAL | Status: AC
  Administered 2015-05-19: 650 mg via ORAL
  Filled 2015-05-19: qty 2

## 2015-05-19 MED ORDER — FOSFOMYCIN TROMETHAMINE 3 G PO PACK
3.0000 g | PACK | ORAL | Status: AC
  Administered 2015-05-19: 3 g via ORAL
  Filled 2015-05-19: qty 3

## 2015-05-19 NOTE — Discharge Instructions (Signed)
You have been seen in the Emergency Department (ED) today for a fall.  Your work up does not show any concerning injuries.  You may have a Urinary Tract Infection.  Please follow up with your doctor regarding today's Emergency Department (ED) visit and your recent fall.    Return to the ED if you have any headache, confusion, slurred speech, weakness/numbness of any arm or leg, or any increased pain.   Urinary Tract Infection A urinary tract infection (UTI) can occur any place along the urinary tract. The tract includes the kidneys, ureters, bladder, and urethra. A type of germ called bacteria often causes a UTI. UTIs are often helped with antibiotic medicine.  HOME CARE   If given, take antibiotics as told by your doctor. Finish them even if you start to feel better.  Drink enough fluids to keep your pee (urine) clear or pale yellow.  Avoid tea, drinks with caffeine, and bubbly (carbonated) drinks.  Pee often. Avoid holding your pee in for a long time.  Pee before and after having sex (intercourse).  Wipe from front to back after you poop (bowel movement) if you are a woman. Use each tissue only once. GET HELP RIGHT AWAY IF:   You have back pain.  You have lower belly (abdominal) pain.  You have chills.  You feel sick to your stomach (nauseous).  You throw up (vomit).  Your burning or discomfort with peeing does not go away.  You have a fever.  Your symptoms are not better in 3 days. MAKE SURE YOU:   Understand these instructions.  Will watch your condition.  Will get help right away if you are not doing well or get worse.   This information is not intended to replace advice given to you by your health care provider. Make sure you discuss any questions you have with your health care provider.   Document Released: 06/24/2007 Document Revised: 01/26/2014 Document Reviewed: 08/06/2011 Elsevier Interactive Patient Education [employer]

## 2015-05-19 NOTE — ED Notes (Signed)
Pt presents via EMS from [facility], alert and confused. Pt sustain unwitnessed fall w/ unknown down time, w/o c/o pain unless palpated. Pt c/o R hip pain w/ palpation, denies other pain. No obvious shortening or rotation of RLE. Pt pink, warm, dry, in no acute distress at this time. Unlabored respirations, NSR on monitor, SAO2 100% on room air, lung clear, BS + X4 quads, distal pulses palpable and strong to moderate. Pt has significant dependent edema to Bilateral LE, +3. Pt is A&O x 0, unable to state name, but is pleasantly extremely confused. Requesting to go home.

## 2015-05-19 NOTE — ED Provider Notes (Signed)
[facility] Emergency Department Provider Note  ____________________________________________  Time seen: Approximately 8:48 PM  I have reviewed the triage vital signs and the nursing notes.   HISTORY  Chief Complaint Fall  EM caveat: Patient has fairly severe dementia, unable to recall event  HPI KIESHA HALLING is a 82 y.o. female history of hypertension, dementia. Multiple previous falls.  The patient had an unwitnessed fall, thought to have slipped out of her bed at [facility]. Discussed with her son reports the patient trips and falls frequently. She does have a history of frequent UTIs as well though.  The patient denies any concerns or pain. Patient is confused however. She is fully alert, but disoriented to place and situation. She does not exhibit any evidence of clear psychomotor agitation or waxing and waning mental status to suggest delirium. Per the patient's son she has severe advanced dementia.  Evidently the patient had noted at some point or out was noted that she may have some right hip discomfort. At this time she denies any, and when asked to move she moves her hips and legs quite well without pain. She moves her arms and legs well without discomfort at this time.  The patient was not found unconscious. No evidence of head injury was noted.  Past Medical History  Diagnosis Date  . Hypertension     There are no active problems to display for this patient.   No past surgical history on file.  Current Outpatient Rx  Name  Route  Sig  Dispense  Refill  . acetaminophen (TYLENOL) 500 MG tablet   Oral   Take 500 mg by mouth every 4 (four) hours as needed for mild pain, fever or headache. *Do not exceed 2000 mg per day*         . acetaminophen (TYLENOL) 650 MG CR tablet   Oral   Take 650 mg by mouth every 8 (eight) hours.         Marland Kitchen alum & mag hydroxide-simeth (MAALOX PLUS) 400-400-40 MG/5ML suspension   Oral   Take 30 mLs  by mouth See admin instructions. Take 30 ml by mouth up to 4 times a day as needed for heartburn/indigestion. *Not to exceed 4 doses in 24 hours*         . aspirin 81 MG chewable tablet   Oral   Chew 81 mg by mouth daily. *Take with food or milk*         . azelastine (ASTELIN) 0.1 % nasal spray   Each Nare   Place 1 spray into both nostrils 2 (two) times daily as needed for rhinitis. Use in each nostril as directed         . calcium carbonate (OS-CAL) 600 MG TABS tablet   Oral   Take 600 mg by mouth daily. Take at noon.         . Cholecalciferol (EQL VITAMIN D3) 2000 units CAPS   Oral   Take 2,000 Units by mouth daily.         Marland Kitchen donepezil (ARICEPT) 10 MG tablet   Oral   Take 20 mg by mouth at bedtime. *Note dose*         . ENSURE (ENSURE)   Oral   Take 1 Can by mouth 3 (three) times daily with meals.         . folic acid (FOLVITE) 1 MG tablet   Oral   Take 1 mg by mouth daily.         Marland Kitchen  guaiFENesin (MUCINEX) 600 MG 12 hr tablet   Oral   Take 600 mg by mouth 2 (two) times daily as needed for cough or to loosen phlegm.         Marland Kitchen guaifenesin (ROBAFEN) 100 MG/5ML syrup   Oral   Take 200 mg by mouth every 6 (six) hours as needed for cough. *Not to exceed 4 doses in 24 hours*         . loperamide (IMODIUM) 2 MG capsule   Oral   Take 2 mg by mouth every 3 (three) hours as needed for diarrhea or loose stools. *Do not exceed 8 doses in 24 hrs*         . magnesium hydroxide (MILK OF MAGNESIA) 400 MG/5ML suspension   Oral   Take 30 mLs by mouth at bedtime as needed for mild constipation or moderate constipation.         . mirtazapine (REMERON) 7.5 MG tablet   Oral   Take 7.5 mg by mouth daily.         . ondansetron (ZOFRAN-ODT) 4 MG disintegrating tablet   Oral   Take 4 mg by mouth every 6 (six) hours as needed for nausea or vomiting.         . pantoprazole (PROTONIX) 40 MG tablet   Oral   Take 40 mg by mouth daily.         . traMADol  (ULTRAM) 50 MG tablet   Oral   Take 50 mg by mouth every 6 (six) hours as needed for moderate pain or severe pain.          No anticoagulants noted. Allergies Other; Penicillins; Quinolones; and Statins  No family history on file.  Social History Social History  Substance Use Topics  . Smoking status: Not on file  . Smokeless tobacco: Not on file  . Alcohol Use: Not on file    Review of Systems Patient has been in her normal state of health. At the present time the patient's son reports she is acting normally. ____________________________________________   PHYSICAL EXAM:  VITAL SIGNS: ED Triage Vitals  Enc Vitals Group     BP 05/19/15 2000 196/89 mmHg     Pulse Rate 05/19/15 2000 77     Resp 05/19/15 2010 19     Temp 05/19/15 2010 98.4 F (36.9 C)     Temp Source 05/19/15 2010 Oral     SpO2 05/19/15 1950 93 %     Weight 05/19/15 2010 125 lb (56.7 kg)     Height 05/19/15 2010 5' (1.524 m)     Head Cir --      Peak Flow --      Pain Score --      Pain Loc --      Pain Edu? --      Excl. in GC? --    Constitutional: Alert and orientedTo self but not to place or date. Well appearing and in no acute distress though slightly anxious. Eyes: Conjunctivae are normal. PERRL. EOMI. Head: Atraumatic. Nose: No congestion/rhinnorhea. Mouth/Throat: Mucous membranes are moist.  Oropharynx non-erythematous. Neck: No stridor.  No cervical spine tenderness. No deformity. Cardiovascular: Normal rate, regular rhythm. Grossly normal heart sounds.  Good peripheral circulation. Respiratory: Normal respiratory effort.  No retractions. Lungs CTAB. Gastrointestinal: Soft and nontender. No distention. No thoracic or lumbar tenderness. Musculoskeletal: No lower extremity tenderness though she does have 2+ lower extremity pitting edema which is evidently chronic.  No joint effusions.  RIGHT Right upper extremity demonstrates normal strength, good use of all muscles. No edema bruising or  contusions of the right shoulder/upper arm, right elbow, right forearm / hand. Full range of motion of the right right upper extremity without pain. No evidence of trauma. Strong radial pulse. Intact median/ulnar/radial neuro-muscular exam.  LEFT Left upper extremity demonstrates normal strength, good use of all muscles. No edema bruising or contusions of the left shoulder/upper arm, left elbow, left forearm / hand. Full range of motion of the left  upper extremity without pain. No evidence of trauma. Strong radial pulse. Intact median/ulnar/radial neuro-muscular exam.  Lower Extremities  No edema. Normal DP/PT pulses bilateral with good cap refill.  Normal neuro-motor function lower extremities bilateral.  RIGHT Right lower extremity demonstrates normal strength, good use of all muscles. No edema bruising or contusions of the right hip, right knee, right ankle. Full range of motion of the right lower extremity without pain. No pain on axial loading. No evidence of trauma.  LEFT Left lower extremity demonstrates normal strength, good use of all muscles. No edema bruising or contusions of the hip,  knee, ankle. Full range of motion of the left lower extremity without pain. No pain on axial loading. No evidence of trauma.   Neurologic:  Normal speech and language. No gross focal neurologic deficits are appreciated. Skin:  Skin is warm, dry and intact. No rash noted. Psychiatric: Speech and behavior are normal.  ____________________________________________   LABS (all labs ordered are listed, but only abnormal results are displayed)  Labs Reviewed  URINALYSIS COMPLETEWITH MICROSCOPIC ([facility] ONLY) - Abnormal; Notable for the following:    Color, Urine YELLOW (*)    APPearance HAZY (*)    Nitrite POSITIVE (*)    Bacteria, UA FEW (*)    All other components within normal limits  GLUCOSE, CAPILLARY - Abnormal; Notable for the following:    Glucose-Capillary 103 (*)    All other components  within normal limits  BASIC METABOLIC PANEL  CBC  CBG MONITORING, ED  CBG MONITORING, ED   ____________________________________________  EKG  Reviewed and interpreted by me at 2005 Heart rate 76 PR 190 QRS 90 QTc 480 Reviewed and interpreted as normal sinus rhythm, probable incomplete right bundle-branch block. No evidence of acute ischemic change. ____________________________________________  RADIOLOGY  DG HIP UNILAT WITH PELVIS 2-3 VIEWS RIGHT (Final result) Result time: 05/19/15 21:21:32   Final result by Rad Results In Interface (05/19/15 21:21:32)   Narrative:   CLINICAL DATA: Found on floor. Presumed unwitnessed fall.  EXAM: DG HIP (WITH OR WITHOUT PELVIS) 2-3V RIGHT  COMPARISON: None.  FINDINGS: There is no evidence of hip fracture or dislocation. There is no evidence of arthropathy or other focal bone abnormality.  IMPRESSION: Negative.   Electronically Signed By: Ellery Plunk M.D. On: 05/19/2015 21:21       ____________________________________________   PROCEDURES  Procedure(s) performed: None  Critical Care performed: No  ____________________________________________   INITIAL IMPRESSION / ASSESSMENT AND PLAN / ED COURSE  Pertinent labs & imaging results that were available during my care of the patient were reviewed by me and considered in my medical decision making (see chart for details).  Patient reportedly having an unwitnessed fall. She does have a history of frequent falls, and per her son sometimes has frequent UTIs. She does not have any evidence of clear traumatic injury at this time, there was no report of major head injury, loss of consciousness and she does not take any anticoagulants  to think that the need for CT imaging of the head and neck is required at this time. She apparently had a low risk mechanism including probably sliding out of bed. She does not demonstrate any evidence of injury to clinical examination  at this time, but given her report of possibly having some right hip tenderness (which I do not demonstrate a my exam at this time) I will obtain x-ray.  Discussed the case with the patient's son is at the bedside and he would like to have minimal testing. I think this is agreeable and we discussed and offered evaluation since he was unwitnessed fall including CAT scans and blood work, over the son denied this. He is very reasonable, he wishes for his mom to be able to go home, and otherwise just wishes for her to have evaluation of her urine for a UTI.  Urinalysis does show a few bacteria and nitrates and given her age of 71, we will treat her with single dose of fosfomycin. No evidence of injury to the hip, and son states that she is in her normal health for her which includes fairly severe dementia. He would like to take her home, understands her blood pressure is high and states that they will monitor this. I think this is all very reasonable given the patient's age, and goals of care as stated by the son. He'll return to the ER if they demonstrate her find any other new concerns or injuries injuries though I find no evidence of significant injury today. ____________________________________________   FINAL CLINICAL IMPRESSION(S) / ED DIAGNOSES  Final diagnoses:  Fall, initial encounter  Urinary tract infection, acute      Sharyn Creamer, MD 05/19/15 2143

## 2015-05-28 ENCOUNTER — Emergency Department
Admission: EM | Admit: 2015-05-28 | Discharge: 2015-05-28 | Disposition: A | Discharge status: Skilled Nursing Facility | Payer: Medicare HMO | Attending: Emergency Medicine | Admitting: Emergency Medicine

## 2015-05-28 DIAGNOSIS — M545 Low back pain: Secondary | ICD-10-CM | POA: Insufficient documentation

## 2015-05-28 DIAGNOSIS — E785 Hyperlipidemia, unspecified: Secondary | ICD-10-CM | POA: Insufficient documentation

## 2015-05-28 DIAGNOSIS — Y999 Unspecified external cause status: Secondary | ICD-10-CM | POA: Insufficient documentation

## 2015-05-28 DIAGNOSIS — W010XXD Fall on same level from slipping, tripping and stumbling without subsequent striking against object, subsequent encounter: Secondary | ICD-10-CM | POA: Diagnosis not present

## 2015-05-28 DIAGNOSIS — Z79899 Other long term (current) drug therapy: Secondary | ICD-10-CM | POA: Diagnosis not present

## 2015-05-28 DIAGNOSIS — G309 Alzheimer's disease, unspecified: Secondary | ICD-10-CM | POA: Diagnosis not present

## 2015-05-28 DIAGNOSIS — Z7982 Long term (current) use of aspirin: Secondary | ICD-10-CM | POA: Insufficient documentation

## 2015-05-28 DIAGNOSIS — Z791 Long term (current) use of non-steroidal anti-inflammatories (NSAID): Secondary | ICD-10-CM | POA: Diagnosis not present

## 2015-05-28 DIAGNOSIS — Y9301 Activity, walking, marching and hiking: Secondary | ICD-10-CM | POA: Diagnosis not present

## 2015-05-28 DIAGNOSIS — I10 Essential (primary) hypertension: Secondary | ICD-10-CM | POA: Diagnosis not present

## 2015-05-28 DIAGNOSIS — Y929 Unspecified place or not applicable: Secondary | ICD-10-CM | POA: Insufficient documentation

## 2015-05-28 DIAGNOSIS — M810 Age-related osteoporosis without current pathological fracture: Secondary | ICD-10-CM | POA: Insufficient documentation

## 2015-05-28 DIAGNOSIS — M069 Rheumatoid arthritis, unspecified: Secondary | ICD-10-CM | POA: Insufficient documentation

## 2015-05-28 DIAGNOSIS — W19XXXD Unspecified fall, subsequent encounter: Secondary | ICD-10-CM

## 2015-05-28 HISTORY — DX: Dementia in other diseases classified elsewhere, unspecified severity, without behavioral disturbance, psychotic disturbance, mood disturbance, and anxiety: F02.80

## 2015-05-28 HISTORY — DX: Rheumatoid arthritis, unspecified: M06.9

## 2015-05-28 HISTORY — DX: Hyperlipidemia, unspecified: E78.5

## 2015-05-28 HISTORY — DX: Age-related osteoporosis without current pathological fracture: M81.0

## 2015-05-28 HISTORY — DX: Alzheimer's disease, unspecified: G30.9

## 2015-05-28 NOTE — ED Provider Notes (Signed)
[facility] Emergency Department Provider Note   ____________________________________________  Time seen: Approximately 11:10 AM  I have reviewed the triage vital signs and the nursing notes.   HISTORY  Chief Complaint Fall   HPI Amanda Watkins is a 82 y.o. female with a history of dementia and frequent falls was presenting to the emergency department after witnessed fall. Per EMS, she was walking with her walker when she tripped and fell. It is unclear whether she fell forward or backwards. However, there was no head trauma. No loss of consciousness. The patient has no complaints at this time although there were some reports that she was complaining of low lumbar pain during transport.  On review of her records the patient was seen recently here on April 30. She was diagnosed with urinary tract infection after a fall and treated with a single dose of antibiotics. No culture on file.   Past Medical History  Diagnosis Date  . Hypertension   . Alzheimer disease   . Rheumatoid arthritis (HCC)   . Hyperlipidemia   . Osteoporosis     There are no active problems to display for this patient.   No past surgical history on file.  Current Outpatient Rx  Name  Route  Sig  Dispense  Refill  . acetaminophen (TYLENOL) 500 MG tablet   Oral   Take 500 mg by mouth every 4 (four) hours as needed for mild pain, fever or headache. *Do not exceed 2000 mg per day*         . acetaminophen (TYLENOL) 650 MG CR tablet   Oral   Take 650 mg by mouth every 8 (eight) hours.         Marland Kitchen alum & mag hydroxide-simeth (MAALOX PLUS) 400-400-40 MG/5ML suspension   Oral   Take 30 mLs by mouth See admin instructions. Take 30 ml by mouth up to 4 times a day as needed for heartburn/indigestion. *Not to exceed 4 doses in 24 hours*         . aspirin 81 MG chewable tablet   Oral   Chew 81 mg by mouth daily. *Take with food or milk*         . azelastine (ASTELIN) 0.1 %  nasal spray   Each Nare   Place 1 spray into both nostrils 2 (two) times daily as needed for rhinitis. Use in each nostril as directed         . calcium carbonate (OS-CAL) 600 MG TABS tablet   Oral   Take 600 mg by mouth daily. Take at noon.         . Cholecalciferol (EQL VITAMIN D3) 2000 units CAPS   Oral   Take 2,000 Units by mouth daily.         Marland Kitchen donepezil (ARICEPT) 10 MG tablet   Oral   Take 20 mg by mouth at bedtime. *Note dose*         . ENSURE (ENSURE)   Oral   Take 1 Can by mouth 3 (three) times daily with meals.         . folic acid (FOLVITE) 1 MG tablet   Oral   Take 1 mg by mouth daily.         Marland Kitchen guaiFENesin (MUCINEX) 600 MG 12 hr tablet   Oral   Take 600 mg by mouth 2 (two) times daily as needed for cough or to loosen phlegm.         Marland Kitchen guaifenesin (  ROBAFEN) 100 MG/5ML syrup   Oral   Take 200 mg by mouth every 6 (six) hours as needed for cough. *Not to exceed 4 doses in 24 hours*         . loperamide (IMODIUM) 2 MG capsule   Oral   Take 2 mg by mouth every 3 (three) hours as needed for diarrhea or loose stools. *Do not exceed 8 doses in 24 hrs*         . magnesium hydroxide (MILK OF MAGNESIA) 400 MG/5ML suspension   Oral   Take 30 mLs by mouth at bedtime as needed for mild constipation or moderate constipation.         . mirtazapine (REMERON) 7.5 MG tablet   Oral   Take 7.5 mg by mouth daily.         . ondansetron (ZOFRAN-ODT) 4 MG disintegrating tablet   Oral   Take 4 mg by mouth every 6 (six) hours as needed for nausea or vomiting.         . pantoprazole (PROTONIX) 40 MG tablet   Oral   Take 40 mg by mouth daily.         . traMADol (ULTRAM) 50 MG tablet   Oral   Take 50 mg by mouth every 6 (six) hours as needed for moderate pain or severe pain.           Allergies Other; Penicillins; Quinolones; and Statins  No family history on file.  Social History Social History  Substance Use Topics  . Smoking status:  Unknown If Ever Smoked  . Smokeless tobacco: None  . Alcohol Use: No    Review of Systems Constitutional: No fever/chills Eyes: No visual changes. ENT: No sore throat. Cardiovascular: Denies chest pain. Respiratory: Denies shortness of breath. Gastrointestinal: No abdominal pain.  No nausea, no vomiting.  No diarrhea.  No constipation. Genitourinary: Negative for dysuria. Musculoskeletal: Negative for back pain. Skin: Negative for rash. Neurological: Negative for headaches, focal weakness or numbness.  10-point ROS otherwise negative. However, review of systems is moderately confounded by the patient's dementia.  ____________________________________________   PHYSICAL EXAM:  VITAL SIGNS: ED Triage Vitals  Enc Vitals Group     BP 05/28/15 1053 153/92 mmHg     Pulse Rate 05/28/15 1053 74     Resp 05/28/15 1053 16     Temp 05/28/15 1053 97.7 F (36.5 C)     Temp Source 05/28/15 1053 Oral     SpO2 05/28/15 1053 99 %     Weight 05/28/15 1053 115 lb (52.164 kg)     Height 05/28/15 1053 5\' 3"  (1.6 m)     Head Cir --      Peak Flow --      Pain Score --      Pain Loc --      Pain Edu? --      Excl. in GC? --     Constitutional: Alert and orientedTo self. Well appearing and in no acute distress. Patient is smiling and laughing. In good spirits. Eyes: Conjunctivae are normal. PERRL. EOMI. Head: Atraumatic. Nose: No congestion/rhinnorhea. Mouth/Throat: Mucous membranes are moist.   Neck: No stridor.  Ranges neck freely without any outward signs of pain or restriction. No tenderness to the C-spine. No deformity or step-off. Cardiovascular: Normal rate, regular rhythm. Grossly normal heart sounds.   Respiratory: Normal respiratory effort.  No retractions. Lungs CTAB. Gastrointestinal: Soft and nontender. No distention.  No CVA tenderness. Musculoskeletal: No lower extremity  tenderness nor edema.  No joint effusions. No tenderness to the T or L spines. No deformity or step-off.  No bruising noted. Pelvis is stable and nontender. Full passive and active range of the hips without any restriction. Able to ambulate the patient with assistance and does not have any pain with ambulation. She does normally walk with a walker. Neurologic:  Normal speech and language. No gross focal neurologic deficits are appreciated. No gait instability. Skin:  Skin is warm, dry and intact. No rash noted. Psychiatric: Mood and affect are normal. Speech and behavior are normal.  ____________________________________________   LABS (all labs ordered are listed, but only abnormal results are displayed)  Labs Reviewed - No data to display ____________________________________________  EKG   ____________________________________________  RADIOLOGY   ____________________________________________   PROCEDURES   ____________________________________________   INITIAL IMPRESSION / ASSESSMENT AND PLAN / ED COURSE  Pertinent labs & imaging results that were available during my care of the patient were reviewed by me and considered in my medical decision making (see chart for details).  ----------------------------------------- 11:20 AM on 05/28/2015 ----------------------------------------- I discussed case with the patient's son and power of attorney, Amanda Watkins, who would not like to pursue an extensive workup at this time. I discussed the circumstances of the fall is reported by EMS. I also discussed the patient appears well and has no signs of trauma. After reading the patient's last visit note, the plan seems to be have a minimalist approach to the patient's medical workups. I discuss this ongoing plan with the son, Amanda Watkins, and he agrees with this at this time. Will not pursue any imaging or lab testing at this time. He does say that the patient falls more when she is urinary tract infections but she was just treated and he says that he would not like the patient catheterized and worked up  for this at this point. The son says that he will be discussing his mother's ambulatory status with the attending physician at her nursing home and suggesting that she spent more time in a wheelchair instead of ambulatory to avoid further falls.  ____________________________________________   FINAL CLINICAL IMPRESSION(S) / ED DIAGNOSES  Final diagnoses:  Fall, subsequent encounter      NEW MEDICATIONS STARTED DURING THIS VISIT:  New Prescriptions   No medications on file     Note:  This document was prepared using Dragon voice recognition software and may include unintentional dictation errors.    [name], MD 05/28/15 [phone]

## 2015-05-28 NOTE — ED Notes (Signed)
Pt transported via EMS back to [facility] at this time.  Report called earlier to Morrie Sheldon, Charity fundraiser at [facility]

## 2015-05-28 NOTE — ED Notes (Addendum)
Pt came to ED from [facility] after mechanical fall. Pt was walking down hallway with walker, tripped and fell. Pt history of Alzheimers dementia. Not currently c/o pain. Per EMS, pt was c/o lumbar pain.

## 2015-05-28 NOTE — ED Notes (Signed)
Pt trying to get out of bed while in room; pt with hx of dementia.  Pt given update and reoriented to situation.  Relocated pt to 19H to be within sight of nurses desk.

## 2015-06-18 ENCOUNTER — Emergency Department: Payer: Medicare HMO

## 2015-06-18 ENCOUNTER — Emergency Department
Admission: EM | Admit: 2015-06-18 | Discharge: 2015-06-18 | Disposition: A | Discharge status: Home / Self Care | Payer: Medicare HMO | Attending: Emergency Medicine | Admitting: Emergency Medicine

## 2015-06-18 ENCOUNTER — Encounter: Payer: Self-pay | Admitting: Emergency Medicine

## 2015-06-18 DIAGNOSIS — M810 Age-related osteoporosis without current pathological fracture: Secondary | ICD-10-CM | POA: Insufficient documentation

## 2015-06-18 DIAGNOSIS — S0990XA Unspecified injury of head, initial encounter: Secondary | ICD-10-CM | POA: Diagnosis present

## 2015-06-18 DIAGNOSIS — E785 Hyperlipidemia, unspecified: Secondary | ICD-10-CM | POA: Insufficient documentation

## 2015-06-18 DIAGNOSIS — G309 Alzheimer's disease, unspecified: Secondary | ICD-10-CM | POA: Diagnosis not present

## 2015-06-18 DIAGNOSIS — I10 Essential (primary) hypertension: Secondary | ICD-10-CM | POA: Diagnosis not present

## 2015-06-18 DIAGNOSIS — W010XXA Fall on same level from slipping, tripping and stumbling without subsequent striking against object, initial encounter: Secondary | ICD-10-CM | POA: Diagnosis not present

## 2015-06-18 DIAGNOSIS — M069 Rheumatoid arthritis, unspecified: Secondary | ICD-10-CM | POA: Diagnosis not present

## 2015-06-18 DIAGNOSIS — Y999 Unspecified external cause status: Secondary | ICD-10-CM | POA: Diagnosis not present

## 2015-06-18 DIAGNOSIS — S0001XA Abrasion of scalp, initial encounter: Secondary | ICD-10-CM | POA: Insufficient documentation

## 2015-06-18 DIAGNOSIS — Y92002 Bathroom of unspecified non-institutional (private) residence single-family (private) house as the place of occurrence of the external cause: Secondary | ICD-10-CM | POA: Diagnosis not present

## 2015-06-18 DIAGNOSIS — Z79899 Other long term (current) drug therapy: Secondary | ICD-10-CM | POA: Diagnosis not present

## 2015-06-18 DIAGNOSIS — Y939 Activity, unspecified: Secondary | ICD-10-CM | POA: Diagnosis not present

## 2015-06-18 NOTE — ED Notes (Signed)
Discharge instructions reviewed with nursing home staff, staff verbalized understanding. Patient taken back to facility via stretcher by EMS.

## 2015-06-18 NOTE — ED Notes (Addendum)
Pt arrived via EMS from [facility] after falling. Pt thinks she tripped and fell. Pt bit her tongue, no bleeding at this time. Pt denies any pain anywhere. Pt hypertensive but facility reported to EMS that this is normal for pt. Ankles also swollen bilaterally and per facility this is normal for pt.

## 2015-06-18 NOTE — ED Provider Notes (Signed)
[facility] Emergency Department Provider Note ____________________________________________  Time seen: Approximately 8:39 PM  I have reviewed the triage vital signs and the nursing notes.   HISTORY  Chief Complaint Fall    HPI Amanda Watkins is a 82 y.o. female with h/o Alzheimers who presents via EMS from [facility] after she sustained a mechanical fall, causing head abrasion and she bit her tongue. She denies any pain aside from her head. He has not been ill recently. Facility reports patient is hypertensive at baseline.  Past Medical History  Diagnosis Date  . Hypertension   . Alzheimer disease   . Rheumatoid arthritis (HCC)   . Hyperlipidemia   . Osteoporosis     There are no active problems to display for this patient.   History reviewed. No pertinent past surgical history.  Current Outpatient Rx  Name  Route  Sig  Dispense  Refill  . acetaminophen (TYLENOL) 500 MG tablet   Oral   Take 500 mg by mouth every 4 (four) hours as needed for mild pain, fever or headache. *Do not exceed 2000 mg per day*         . acetaminophen (TYLENOL) 650 MG CR tablet   Oral   Take 650 mg by mouth every 8 (eight) hours.         Marland Kitchen alum & mag hydroxide-simeth (MAALOX PLUS) 400-400-40 MG/5ML suspension   Oral   Take 30 mLs by mouth See admin instructions. Take 30 ml by mouth up to 4 times a day as needed for heartburn/indigestion. *Not to exceed 4 doses in 24 hours*         . azelastine (ASTELIN) 0.1 % nasal spray   Each Nare   Place 1 spray into both nostrils 2 (two) times daily as needed for rhinitis. Use in each nostril as directed         . calcium carbonate (OS-CAL) 600 MG TABS tablet   Oral   Take 600 mg by mouth daily. Take at noon.         . Cholecalciferol (EQL VITAMIN D3) 2000 units CAPS   Oral   Take 2,000 Units by mouth daily.         Marland Kitchen donepezil (ARICEPT) 10 MG tablet   Oral   Take 20 mg by mouth at bedtime. *Note  dose*         . ENSURE (ENSURE)   Oral   Take 1 Can by mouth 3 (three) times daily with meals.         . folic acid (FOLVITE) 1 MG tablet   Oral   Take 1 mg by mouth daily.         Marland Kitchen guaiFENesin (MUCINEX) 600 MG 12 hr tablet   Oral   Take 600 mg by mouth 2 (two) times daily as needed for cough or to loosen phlegm.         Marland Kitchen guaifenesin (ROBAFEN) 100 MG/5ML syrup   Oral   Take 200 mg by mouth every 6 (six) hours as needed for cough. *Not to exceed 4 doses in 24 hours*         . loperamide (IMODIUM) 2 MG capsule   Oral   Take 2 mg by mouth every 3 (three) hours as needed for diarrhea or loose stools. *Do not exceed 8 doses in 24 hrs*         . magnesium hydroxide (MILK OF MAGNESIA) 400 MG/5ML suspension   Oral   Take  30 mLs by mouth at bedtime as needed for mild constipation or moderate constipation.         . mirtazapine (REMERON) 7.5 MG tablet   Oral   Take 7.5 mg by mouth daily.         . ondansetron (ZOFRAN-ODT) 4 MG disintegrating tablet   Oral   Take 4 mg by mouth every 6 (six) hours as needed for nausea or vomiting.         . pantoprazole (PROTONIX) 40 MG tablet   Oral   Take 40 mg by mouth daily.         . traMADol (ULTRAM) 50 MG tablet   Oral   Take 50 mg by mouth every 6 (six) hours as needed for moderate pain or severe pain.           Allergies Other; Penicillins; Quinolones; and Statins  History reviewed. No pertinent family history.  Social History Social History  Substance Use Topics  . Smoking status: Unknown If Ever Smoked  . Smokeless tobacco: None  . Alcohol Use: No    Review of Systems Constitutional: No feverEyes: No visual changes. Cardiovascular: Denies chest pain. Respiratory: Denies shortness of breath. Gastrointestinal: No abdominal pain.   Musculoskeletal: Negative for back pain. Skin: Negative for rash. Neurological: see hpi  10-point ROS otherwise  negative.  ____________________________________________   PHYSICAL EXAM:  VITAL SIGNS: ED Triage Vitals  Enc Vitals Group     BP 06/18/15 2025 191/78 mmHg     Pulse Rate 06/18/15 2025 72     Resp 06/18/15 2025 21     Temp 06/18/15 2025 98.2 F (36.8 C)     Temp Source 06/18/15 2025 Oral     SpO2 06/18/15 2025 97 %     Weight 06/18/15 2025 115 lb (52.164 kg)     Height 06/18/15 2025 5\' 3"  (1.6 m)     Head Cir --      Peak Flow --      Pain Score --      Pain Loc --      Pain Edu? --      Excl. in GC? --    Constitutional: Alert. Well appearing and in no acute distress. Eyes: Conjunctivae are normal. PERRL. EOMI. Head: abrasion R frontal scalp Nose: No congestion/rhinnorhea. Mouth/Throat: Mucous membranes are moist.  Oropharynx non-erythematous. Neck: No stridor.   Cardiovascular: Normal rate, regular rhythm. Grossly normal heart sounds.  Good peripheral circulation. Respiratory: Normal respiratory effort.  No retractions. Lungs CTAB. Gastrointestinal: Soft and nontender. No distention. No abdominal bruits. No CVA tenderness. Musculoskeletal: No lower extremity tenderness nor edema.   Neurologic:  Normal speech and language. No gross focal neurologic deficits are appreciated. No gait instability. Skin:  Skin is warm, dry and intact. No rash noted. Psychiatric: Mood and affect are normal. Speech and behavior are normal.  ____________________________________________   LABS (all labs ordered are listed, but only abnormal results are displayed)  Labs Reviewed - No data to display ____________________________________________ ____________________________________________  RADIOLOGY  CT head-IMPRESSION: Soft tissue swelling/hematoma overlying the right frontal bone. No evidence of calvarial fracture.  No evidence of acute intracranial abnormality. Atrophy with small vessel ischemic changes.   Electronically Signed  By: M.D.  On: 06/18/2015  21:31 ____________________________________________    INITIAL IMPRESSION / ASSESSMENT AND PLAN / ED COURSE  Pertinent labs & imaging results that were available during my care of the patient were reviewed by me and considered in my medical decision  making (see chart for details).  She appears to be alert and appropriate. Given negative head CT, we'll discharge back to care facility. Blood pressure appears to be chronically high from chart review and also per [facility]. ____________________________________________   FINAL CLINICAL IMPRESSION(S) / ED DIAGNOSES  Final diagnoses:  Scalp abrasion, initial encounter      New prescriptions started this visit New Prescriptions   No medications on file    Maurilio Lovely, MD 06/18/15 2353

## 2015-06-18 NOTE — ED Notes (Signed)
Pt assisted up to bathroom then back to stretcher.

## 2015-06-18 NOTE — ED Notes (Signed)
Pt has small abrasion or laceration to right, front scalp. Minimal bleeding noted. Pt has no complaints of headache, dizziness, N/V.

## 2015-06-18 NOTE — Discharge Instructions (Signed)
It turns to the ER for new or worsening confusion, vomiting, severe headache, difficulty walking or speaking, signs of infection (redness, pus, fever) or for any other concerns.  Head Injury, Adult You have a head injury. Headaches and throwing up (vomiting) are common after a head injury. It should be easy to wake up from sleeping. Sometimes you must stay in the hospital. Most problems happen within the first 24 hours. Side effects may occur up to 7-10 days after the injury.  WHAT ARE THE TYPES OF HEAD INJURIES? Head injuries can be as minor as a bump. Some head injuries can be more severe. More severe head injuries include:  A jarring injury to the brain (concussion).  A bruise of the brain (contusion). This mean there is bleeding in the brain that can cause swelling.  A cracked skull (skull fracture).  Bleeding in the brain that collects, clots, and forms a bump (hematoma). WHEN SHOULD I GET HELP RIGHT AWAY?   You are confused or sleepy.  You cannot be woken up.  You feel sick to your stomach (nauseous) or keep throwing up (vomiting).  Your dizziness or unsteadiness is getting worse.  You have very bad, lasting headaches that are not helped by medicine. Take medicines only as told by your doctor.  You cannot use your arms or legs like normal.  You cannot walk.  You notice changes in the black spots in the center of the colored part of your eye (pupil).  You have clear or bloody fluid coming from your nose or ears.  You have trouble seeing. During the next 24 hours after the injury, you must stay with someone who can watch you. This person should get help right away (call 911 in the [place] if you start to shake and are not able to control it (have seizures), you pass out, or you are unable to wake up. HOW CAN I PREVENT A HEAD INJURY IN THE FUTURE?  Wear seat belts.  Wear a helmet while bike riding and playing sports like football.  Stay away from dangerous activities around  the house. WHEN CAN I RETURN TO NORMAL ACTIVITIES AND ATHLETICS? See your doctor before doing these activities. You should not do normal activities or play contact sports until 1 week after the following symptoms have stopped:  Headache that does not go away.  Dizziness.  Poor attention.  Confusion.  Memory problems.  Sickness to your stomach or throwing up.  Tiredness.  Fussiness.  Bothered by bright lights or loud noises.  Anxiousness or depression.  Restless sleep. MAKE SURE YOU:   Understand these instructions.  Will watch your condition.  Will get help right away if you are not doing well or get worse.   This information is not intended to replace advice given to you by your health care provider. Make sure you discuss any questions you have with your health care provider.   Document Released: 12/19/2007 Document Revised: 01/26/2014 Document Reviewed: 09/12/2012 Elsevier Interactive Patient Education [employer]

## 2015-06-25 ENCOUNTER — Emergency Department
Admission: EM | Admit: 2015-06-25 | Discharge: 2015-06-25 | Disposition: A | Discharge status: Home / Self Care | Payer: Medicare HMO | Attending: Emergency Medicine | Admitting: Emergency Medicine

## 2015-06-25 ENCOUNTER — Emergency Department: Payer: Medicare HMO

## 2015-06-25 DIAGNOSIS — G309 Alzheimer's disease, unspecified: Secondary | ICD-10-CM | POA: Diagnosis not present

## 2015-06-25 DIAGNOSIS — M069 Rheumatoid arthritis, unspecified: Secondary | ICD-10-CM | POA: Diagnosis not present

## 2015-06-25 DIAGNOSIS — I10 Essential (primary) hypertension: Secondary | ICD-10-CM | POA: Diagnosis not present

## 2015-06-25 DIAGNOSIS — E785 Hyperlipidemia, unspecified: Secondary | ICD-10-CM | POA: Diagnosis not present

## 2015-06-25 DIAGNOSIS — M810 Age-related osteoporosis without current pathological fracture: Secondary | ICD-10-CM | POA: Diagnosis not present

## 2015-06-25 DIAGNOSIS — Z7982 Long term (current) use of aspirin: Secondary | ICD-10-CM | POA: Diagnosis not present

## 2015-06-25 DIAGNOSIS — R079 Chest pain, unspecified: Secondary | ICD-10-CM | POA: Diagnosis present

## 2015-06-25 DIAGNOSIS — Z79899 Other long term (current) drug therapy: Secondary | ICD-10-CM | POA: Diagnosis not present

## 2015-06-25 LAB — URINALYSIS COMPLETE WITH MICROSCOPIC (ARMC ONLY)
BILIRUBIN URINE: NEGATIVE
GLUCOSE, UA: NEGATIVE mg/dL
KETONES UR: NEGATIVE mg/dL
NITRITE: NEGATIVE
PROTEIN: NEGATIVE mg/dL
SPECIFIC GRAVITY, URINE: 1.005 (ref 1.005–1.030)
pH: 6 (ref 5.0–8.0)

## 2015-06-25 LAB — CBC
HEMATOCRIT: 39 % (ref 35.0–47.0)
Hemoglobin: 12.9 g/dL (ref 12.0–16.0)
MCH: 29.6 pg (ref 26.0–34.0)
MCHC: 33.1 g/dL (ref 32.0–36.0)
MCV: 89.6 fL (ref 80.0–100.0)
Platelets: 175 10*3/uL (ref 150–440)
RBC: 4.35 MIL/uL (ref 3.80–5.20)
RDW: 14.6 % — AB (ref 11.5–14.5)
WBC: 7.8 10*3/uL (ref 3.6–11.0)

## 2015-06-25 LAB — BASIC METABOLIC PANEL
ANION GAP: 6 (ref 5–15)
BUN: 13 mg/dL (ref 6–20)
CALCIUM: 9 mg/dL (ref 8.9–10.3)
CO2: 28 mmol/L (ref 22–32)
Chloride: 106 mmol/L (ref 101–111)
Creatinine, Ser: 0.59 mg/dL (ref 0.44–1.00)
GFR calc Af Amer: >60 mL/min (ref >60)
Glucose, Bld: 107 mg/dL — ABNORMAL HIGH (ref 65–99)
POTASSIUM: 3.6 mmol/L (ref 3.5–5.1)
SODIUM: 140 mmol/L (ref 135–145)

## 2015-06-25 LAB — TROPONIN I: Troponin I: <0.03 ng/mL (ref <0.031)

## 2015-06-25 NOTE — Discharge Instructions (Signed)
We sent a urine culture, please follow up on it in 2 days if we do not contact you to make sure there is no uti. If she has chest pain , shortness of breath, fever, vomiting, abdominal pain, or any other new or worrisome symptoms, please return to the emergency department  Chest Pain Observation It is often hard to give a specific diagnosis for the cause of chest pain. Among other possibilities your symptoms might be caused by inadequate oxygen delivery to your heart (angina). Angina that is not treated or evaluated can lead to a heart attack (myocardial infarction) or death. Blood tests, electrocardiograms, and X-rays may have been done to help determine a possible cause of your chest pain. After evaluation and observation, your health care provider has determined that it is unlikely your pain was caused by an unstable condition that requires hospitalization. However, a full evaluation of your pain may need to be completed, with additional diagnostic testing as directed. It is very important to keep your follow-up appointments. Not keeping your follow-up appointments could result in permanent heart damage, disability, or death. If there is any problem keeping your follow-up appointments, you must call your health care provider. HOME CARE INSTRUCTIONS  Due to the slight chance that your pain could be angina, it is important to follow your health care provider's treatment plan and also maintain a healthy lifestyle:  Maintain or work toward achieving a healthy weight.  Stay physically active and exercise regularly.  Decrease your salt intake.  Eat a balanced, healthy diet. Talk to a dietitian to learn about heart-healthy foods.  Increase your fiber intake by including whole grains, vegetables, fruits, and nuts in your diet.  Avoid situations that cause stress, anger, or depression.  Take medicines as advised by your health care provider. Report any side effects to your health care provider. Do not  stop medicines or adjust the dosages on your own.  Quit smoking. Do not use nicotine patches or gum until you check with your health care provider.  Keep your blood pressure, blood sugar, and cholesterol levels within normal limits.  Limit alcohol intake to no more than 1 drink per day for women who are not pregnant and 2 drinks per day for men.  Do not abuse drugs. SEEK IMMEDIATE MEDICAL CARE IF: You have severe chest pain or pressure which may include symptoms such as:  You feel pain or pressure in your arms, neck, jaw, or back.  You have severe back or abdominal pain, feel sick to your stomach (nauseous), or throw up (vomit).  You are sweating profusely.  You are having a fast or irregular heartbeat.  You feel short of breath while at rest.  You notice increasing shortness of breath during rest, sleep, or with activity.  You have chest pain that does not get better after rest or after taking your usual medicine.  You wake from sleep with chest pain.  You are unable to sleep because you cannot breathe.  You develop a frequent cough or you are coughing up blood.  You feel dizzy, faint, or experience extreme fatigue.  You develop severe weakness, dizziness, fainting, or chills. Any of these symptoms may represent a serious problem that is an emergency. Do not wait to see if the symptoms will go away. Call your local emergency services (911 in the [place] Do not drive yourself to the hospital. MAKE SURE YOU:  Understand these instructions.  Will watch your condition.  Will get help right away if  you are not doing well or get worse.   This information is not intended to replace advice given to you by your health care provider. Make sure you discuss any questions you have with your health care provider.   Document Released: 02/07/2010 Document Revised: 01/10/2013 Document Reviewed: 07/07/2012 Elsevier Interactive Patient Education [employer]

## 2015-06-25 NOTE — ED Notes (Signed)
Pt bib EMS from [facility] w/ c/o CP.  Per EMS, pt c/o to staff this AM, however pt denies CP, SOB, n/v/d, LOC or dizziness at this time.  Pt alert, oriented to person. NAD.

## 2015-06-25 NOTE — ED Provider Notes (Addendum)
[facility] Emergency Department Provider Note  ____________________________________________   I have reviewed the triage vital signs and the nursing notes.   HISTORY  Chief Complaint Chest Pain    HPI Amanda Watkins is a 82 y.o. female presents today complaining of the fact nothing. She denies chest pain shows breath nausea vomiting diarrhea, she has no complaints. Patient has a history of Alzheimer's. She denies any recent fall. She was seen here recently for a fall but has not apparently fallen since per the patient has very limited ability to give me a history that this time she denies any complaint of any variety.      Past Medical History  Diagnosis Date  . Hypertension   . Alzheimer disease   . Rheumatoid arthritis (HCC)   . Hyperlipidemia   . Osteoporosis     There are no active problems to display for this patient.   History reviewed. No pertinent past surgical history.  Current Outpatient Rx  Name  Route  Sig  Dispense  Refill  . acetaminophen (TYLENOL) 500 MG tablet   Oral   Take 500 mg by mouth every 4 (four) hours as needed for mild pain, fever or headache. *Do not exceed 2000 mg per day*         . acetaminophen (TYLENOL) 650 MG CR tablet   Oral   Take 650 mg by mouth every 8 (eight) hours.         Marland Kitchen alum & mag hydroxide-simeth (MAALOX PLUS) 400-400-40 MG/5ML suspension   Oral   Take 30 mLs by mouth See admin instructions. Take 30 ml by mouth up to 4 times a day as needed for heartburn/indigestion. *Not to exceed 4 doses in 24 hours*         . azelastine (ASTELIN) 0.1 % nasal spray   Each Nare   Place 1 spray into both nostrils 2 (two) times daily as needed for rhinitis. Use in each nostril as directed         . calcium carbonate (OS-CAL) 600 MG TABS tablet   Oral   Take 600 mg by mouth daily. Take at noon.         . Cholecalciferol (EQL VITAMIN D3) 2000 units CAPS   Oral   Take 2,000 Units by mouth  daily.         Marland Kitchen donepezil (ARICEPT) 10 MG tablet   Oral   Take 20 mg by mouth at bedtime. *Note dose*         . ENSURE (ENSURE)   Oral   Take 1 Can by mouth 3 (three) times daily with meals.         . folic acid (FOLVITE) 1 MG tablet   Oral   Take 1 mg by mouth daily.         Marland Kitchen guaiFENesin (MUCINEX) 600 MG 12 hr tablet   Oral   Take 600 mg by mouth 2 (two) times daily as needed for cough or to loosen phlegm.         Marland Kitchen guaifenesin (ROBAFEN) 100 MG/5ML syrup   Oral   Take 200 mg by mouth every 6 (six) hours as needed for cough. *Not to exceed 4 doses in 24 hours*         . loperamide (IMODIUM) 2 MG capsule   Oral   Take 2 mg by mouth every 3 (three) hours as needed for diarrhea or loose stools. *Do not exceed 8 doses in 24 hrs*         .  magnesium hydroxide (MILK OF MAGNESIA) 400 MG/5ML suspension   Oral   Take 30 mLs by mouth at bedtime as needed for mild constipation or moderate constipation.         . mirtazapine (REMERON) 7.5 MG tablet   Oral   Take 7.5 mg by mouth daily.         . ondansetron (ZOFRAN-ODT) 4 MG disintegrating tablet   Oral   Take 4 mg by mouth every 6 (six) hours as needed for nausea or vomiting.         . pantoprazole (PROTONIX) 40 MG tablet   Oral   Take 40 mg by mouth daily.         . traMADol (ULTRAM) 50 MG tablet   Oral   Take 50 mg by mouth every 6 (six) hours as needed for moderate pain or severe pain.           Allergies Other; Penicillins; Quinolones; and Statins  No family history on file.  Social History Social History  Substance Use Topics  . Smoking status: Unknown If Ever Smoked  . Smokeless tobacco: None  . Alcohol Use: No    Review of Systems, Limited by patient's Alzheimer's Constitutional: No fever/chills Eyes: No visual changes. ENT: No sore throat. No stiff neck no neck pain Cardiovascular: Denies chest pain. Respiratory: Denies shortness of breath. Gastrointestinal:   no vomiting.  No  diarrhea.  No constipation. Genitourinary: Negative for dysuria. Musculoskeletal: Negative lower extremity swelling Skin: Negative for rash. Neurological: Negative for headaches, focal weakness or numbness. 10-point ROS otherwise negative.  ____________________________________________   PHYSICAL EXAM:  VITAL SIGNS: ED Triage Vitals  Enc Vitals Group     BP 06/25/15 1213 181/83 mmHg     Pulse Rate 06/25/15 1213 70     Resp 06/25/15 1213 17     Temp 06/25/15 1213 98 F (36.7 C)     Temp Source 06/25/15 1213 Oral     SpO2 06/25/15 1213 96 %     Weight 06/25/15 1213 115 lb (52.164 kg)     Height --      Head Cir --      Peak Flow --      Pain Score 06/25/15 1213 0     Pain Loc --      Pain Edu? --      Excl. in GC? --     Constitutional: Alert and orientedTo name unsure of Place or day Well appearing and in no acute distress. Eyes: Conjunctivae are normal. PERRL. EOMI. Head: No evidence of recent trauma there is a resolving bruise from her last visit here.. Nose: No congestion/rhinnorhea. Mouth/Throat: Mucous membranes are moist.  Oropharynx non-erythematous. Neck: No stridor.   Nontender with no meningismus Cardiovascular: Normal rate, regular rhythm. Grossly normal heart sounds.  Good peripheral circulation. Respiratory: Normal respiratory effort.  No retractions. Lungs CTAB. Abdominal: Soft and nontender. No distention. No guarding no rebound Back:  There is no focal tenderness or step off there is no midline tenderness there are no lesions noted. there is no CVA tenderness Musculoskeletal: No lower extremity tenderness. No joint effusions, no DVT signs strong distal pulses no edema Neurologic:  Normal speech and language. No gross focal neurologic deficits are appreciated.  Skin:  Skin is warm, dry and intact. No rash noted. Psychiatric: Mood and affect are normal. Speech and behavior are normal.  ____________________________________________   LABS (all labs ordered  are listed, but only abnormal results are displayed)  Labs  Reviewed  BASIC METABOLIC PANEL - Abnormal; Notable for the following:    Glucose, Bld 107 (*)    All other components within normal limits  CBC - Abnormal; Notable for the following:    RDW 14.6 (*)    All other components within normal limits  URINALYSIS COMPLETEWITH MICROSCOPIC ([facility] ONLY) - Abnormal; Notable for the following:    Color, Urine YELLOW (*)    APPearance CLOUDY (*)    Hgb urine dipstick 1+ (*)    Leukocytes, UA 1+ (*)    Bacteria, UA MANY (*)    Squamous Epithelial / LPF 6-30 (*)    All other components within normal limits  TROPONIN I   ____________________________________________  EKG  I personally interpreted any EKGs ordered by me or triage Sinus rhythm rate 67 bpm no acute ST elevation or depression, nonspecific ST changes, partial right bundle block noted. ____________________________________________  RADIOLOGY  I reviewed any imaging ordered by me or triage that were performed during my shift and, if possible, patient and/or family made aware of any abnormal findings. ____________________________________________   PROCEDURES  Procedure(s) performed: None  Critical Care performed: None  ____________________________________________   INITIAL IMPRESSION / ASSESSMENT AND PLAN / ED COURSE  Pertinent labs & imaging results that were available during my care of the patient were reviewed by me and considered in my medical decision making (see chart for details).  Discussed with Victorino Dike at the facility where the patient stays[facility], she states the patient had complained of chest pain as somewhat vague manner. Patient herself has such complaining of this time. She is at her baseline according degenerative for which is significantly demented. Obviously not a good candidate for cardiac catheterization even if she rules in but we will send a second cardiac markers a precaution. Her  urinalysis is somewhat concerning, she does not have any dysuria or frequency however. Prior cultures show that the patient is resistant to most home antibiotics aside from Cipro. Obviously starting Cipro and a patient at her age without convincing evidence of urinary tract infection can cause her significant harm or altered mental status, as well as diarrheal illnesses in this is is visualized patient. Therefore, I think the better part of valor will be to wait for culture to see if this is real. It was not a very good clean catch and she would prefer not to have a catheterization. Certainly there is no other evidence of infection.  ----------------------------------------- 3:16 PM on 06/25/2015 -----------------------------------------  Signed out at the end of my shift to dr. Darnelle Catalan who will follow up on troponin and obs pt pt d.c ____________________________________________   FINAL CLINICAL IMPRESSION(S) / ED DIAGNOSES  Final diagnoses:  None      This chart was dictated using voice recognition software.  Despite best efforts to proofread,  errors can occur which can change meaning.     Jeanmarie Plant, MD 06/25/15 1437  Jeanmarie Plant, MD 06/25/15 1437  Jeanmarie Plant, MD 06/25/15 [phone]

## 2015-06-25 NOTE — ED Provider Notes (Signed)
Patient's troponin second one returns as negative. Patient specifically denies urinary urgency frequency and dysuria several times. She says she feels well. I will not treat the urine specimen which appears to be asymptomatic bacteriuria.  Arnaldo Natal, MD 06/25/15 [phone]

## 2015-06-25 NOTE — ED Notes (Signed)
EMS called for transport.

## 2015-06-27 ENCOUNTER — Telehealth: Payer: Self-pay | Admitting: Emergency Medicine

## 2015-06-30 ENCOUNTER — Encounter: Payer: Self-pay | Admitting: Emergency Medicine

## 2015-06-30 ENCOUNTER — Emergency Department
Admission: EM | Admit: 2015-06-30 | Discharge: 2015-06-30 | Disposition: A | Discharge status: Home / Self Care | Payer: Medicare HMO | Attending: Emergency Medicine | Admitting: Emergency Medicine

## 2015-06-30 ENCOUNTER — Emergency Department: Payer: Medicare HMO

## 2015-06-30 DIAGNOSIS — R0789 Other chest pain: Secondary | ICD-10-CM | POA: Diagnosis not present

## 2015-06-30 DIAGNOSIS — E785 Hyperlipidemia, unspecified: Secondary | ICD-10-CM | POA: Diagnosis not present

## 2015-06-30 DIAGNOSIS — Z7982 Long term (current) use of aspirin: Secondary | ICD-10-CM | POA: Insufficient documentation

## 2015-06-30 DIAGNOSIS — M069 Rheumatoid arthritis, unspecified: Secondary | ICD-10-CM | POA: Diagnosis not present

## 2015-06-30 DIAGNOSIS — I10 Essential (primary) hypertension: Secondary | ICD-10-CM | POA: Diagnosis not present

## 2015-06-30 DIAGNOSIS — Z79899 Other long term (current) drug therapy: Secondary | ICD-10-CM | POA: Insufficient documentation

## 2015-06-30 DIAGNOSIS — R079 Chest pain, unspecified: Secondary | ICD-10-CM

## 2015-06-30 DIAGNOSIS — G309 Alzheimer's disease, unspecified: Secondary | ICD-10-CM | POA: Diagnosis not present

## 2015-06-30 DIAGNOSIS — M810 Age-related osteoporosis without current pathological fracture: Secondary | ICD-10-CM | POA: Diagnosis not present

## 2015-06-30 LAB — CBC
HCT: 38.5 % (ref 35.0–47.0)
Hemoglobin: 12.8 g/dL (ref 12.0–16.0)
MCH: 29.5 pg (ref 26.0–34.0)
MCHC: 33.1 g/dL (ref 32.0–36.0)
MCV: 88.9 fL (ref 80.0–100.0)
PLATELETS: 220 10*3/uL (ref 150–440)
RBC: 4.33 MIL/uL (ref 3.80–5.20)
RDW: 14.9 % — AB (ref 11.5–14.5)
WBC: 7.5 10*3/uL (ref 3.6–11.0)

## 2015-06-30 LAB — BASIC METABOLIC PANEL
Anion gap: 11 (ref 5–15)
BUN: 19 mg/dL (ref 6–20)
CHLORIDE: 102 mmol/L (ref 101–111)
CO2: 26 mmol/L (ref 22–32)
CREATININE: 0.67 mg/dL (ref 0.44–1.00)
Calcium: 9.3 mg/dL (ref 8.9–10.3)
GFR calc Af Amer: >60 mL/min (ref >60)
GFR calc non Af Amer: >60 mL/min (ref >60)
Glucose, Bld: 112 mg/dL — ABNORMAL HIGH (ref 65–99)
Potassium: 3.7 mmol/L (ref 3.5–5.1)
SODIUM: 139 mmol/L (ref 135–145)

## 2015-06-30 LAB — TROPONIN I: Troponin I: <0.03 ng/mL (ref <0.031)

## 2015-06-30 NOTE — Discharge Instructions (Signed)
Patient was evaluated for episode of chest discomfort at the nursing home, and her examine and evaluation are reassuring in the emergency department today.  Return to the emergency room for any worsening condition including fever, cough, trouble breathing, chest pain, dizziness or passing out, or any other symptoms concerning to you.   Nonspecific Chest Pain  Chest pain can be caused by many different conditions. There is always a chance that your pain could be related to something serious, such as a heart attack or a blood clot in your lungs. Chest pain can also be caused by conditions that are not life-threatening. If you have chest pain, it is very important to follow up with your health care provider. CAUSES  Chest pain can be caused by:  Heartburn.  Pneumonia or bronchitis.  Anxiety or stress.  Inflammation around your heart (pericarditis) or lung (pleuritis or pleurisy).  A blood clot in your lung.  A collapsed lung (pneumothorax). It can develop suddenly on its own (spontaneous pneumothorax) or from trauma to the chest.  Shingles infection (varicella-zoster virus).  Heart attack.  Damage to the bones, muscles, and cartilage that make up your chest wall. This can include:  Bruised bones due to injury.  Strained muscles or cartilage due to frequent or repeated coughing or overwork.  Fracture to one or more ribs.  Sore cartilage due to inflammation (costochondritis). RISK FACTORS  Risk factors for chest pain may include:  Activities that increase your risk for trauma or injury to your chest.  Respiratory infections or conditions that cause frequent coughing.  Medical conditions or overeating that can cause heartburn.  Heart disease or family history of heart disease.  Conditions or health behaviors that increase your risk of developing a blood clot.  Having had chicken pox (varicella zoster). SIGNS AND SYMPTOMS Chest pain can feel like:  Burning or tingling on  the surface of your chest or deep in your chest.  Crushing, pressure, aching, or squeezing pain.  Dull or sharp pain that is worse when you move, cough, or take a deep breath.  Pain that is also felt in your back, neck, shoulder, or arm, or pain that spreads to any of these areas. Your chest pain may come and go, or it may stay constant. DIAGNOSIS Lab tests or other studies may be needed to find the cause of your pain. Your health care provider may have you take a test called an ambulatory ECG (electrocardiogram). An ECG records your heartbeat patterns at the time the test is performed. You may also have other tests, such as:  Transthoracic echocardiogram (TTE). During echocardiography, sound waves are used to create a picture of all of the heart structures and to look at how blood flows through your heart.  Transesophageal echocardiogram (TEE).This is a more advanced imaging test that obtains images from inside your body. It allows your health care provider to see your heart in finer detail.  Cardiac monitoring. This allows your health care provider to monitor your heart rate and rhythm in real time.  Holter monitor. This is a portable device that records your heartbeat and can help to diagnose abnormal heartbeats. It allows your health care provider to track your heart activity for several days, if needed.  Stress tests. These can be done through exercise or by taking medicine that makes your heart beat more quickly.  Blood tests.  Imaging tests. TREATMENT  Your treatment depends on what is causing your chest pain. Treatment may include:  Medicines. These may  include:  Acid blockers for heartburn.  Anti-inflammatory medicine.  Pain medicine for inflammatory conditions.  Antibiotic medicine, if an infection is present.  Medicines to dissolve blood clots.  Medicines to treat coronary artery disease.  Supportive care for conditions that do not require medicines. This may  include:  Resting.  Applying heat or cold packs to injured areas.  Limiting activities until pain decreases. HOME CARE INSTRUCTIONS  If you were prescribed an antibiotic medicine, finish it all even if you start to feel better.  Avoid any activities that bring on chest pain.  Do not use any tobacco products, including cigarettes, chewing tobacco, or electronic cigarettes. If you need help quitting, ask your health care provider.  Do not drink alcohol.  Take medicines only as directed by your health care provider.  Keep all follow-up visits as directed by your health care provider. This is important. This includes any further testing if your chest pain does not go away.  If heartburn is the cause for your chest pain, you may be told to keep your head raised (elevated) while sleeping. This reduces the chance that acid will go from your stomach into your esophagus.  Make lifestyle changes as directed by your health care provider. These may include:  Getting regular exercise. Ask your health care provider to suggest some activities that are safe for you.  Eating a heart-healthy diet. A registered dietitian can help you to learn healthy eating options.  Maintaining a healthy weight.  Managing diabetes, if necessary.  Reducing stress. SEEK MEDICAL CARE IF:  Your chest pain does not go away after treatment.  You have a rash with blisters on your chest.  You have a fever. SEEK IMMEDIATE MEDICAL CARE IF:   Your chest pain is worse.  You have an increasing cough, or you cough up blood.  You have severe abdominal pain.  You have severe weakness.  You faint.  You have chills.  You have sudden, unexplained chest discomfort.  You have sudden, unexplained discomfort in your arms, back, neck, or jaw.  You have shortness of breath at any time.  You suddenly start to sweat, or your skin gets clammy.  You feel nauseous or you vomit.  You suddenly feel light-headed or  dizzy.  Your heart begins to beat quickly, or it feels like it is skipping beats. These symptoms may represent a serious problem that is an emergency. Do not wait to see if the symptoms will go away. Get medical help right away. Call your local emergency services (911 in the [place] Do not drive yourself to the hospital.   This information is not intended to replace advice given to you by your health care provider. Make sure you discuss any questions you have with your health care provider.   Document Released: 10/15/2004 Document Revised: 01/26/2014 Document Reviewed: 08/11/2013 Elsevier Interactive Patient Education [employer].

## 2015-06-30 NOTE — ED Notes (Signed)
Pt via EMS from [facility]. Per EMS staff called because patient was dramatically grabbing at chest. Pt has hx of dementia. At time of arrival to ED pt states she is not having any chest pain and is in no acute distress.

## 2015-06-30 NOTE — ED Provider Notes (Signed)
[facility] Emergency Department Provider Note   ____________________________________________  Time seen: Approximately 4:20 PM I have reviewed the triage vital signs and the triage nursing note.  HISTORY  Chief Complaint Chest Pain   [employer], patient with dementia EMS report and nursing home report by phone to the nurse  HPI Amanda Watkins is a 82 y.o. female this is [facility] with a history of dementia reportedly was clutching her chest and they sent her in for chest pain evaluation. Patient does not remember clutching her chest or having chest pain. She's not having chest pain now she denies any shortness of breath. There is no reported history of nausea vomiting or diarrhea recently or fevers.    Past Medical History  Diagnosis Date  . Hypertension   . Alzheimer disease   . Rheumatoid arthritis (HCC)   . Hyperlipidemia   . Osteoporosis     There are no active problems to display for this patient.   History reviewed. No pertinent past surgical history.  Current Outpatient Rx  Name  Route  Sig  Dispense  Refill  . acetaminophen (TYLENOL) 500 MG tablet   Oral   Take 500 mg by mouth every 4 (four) hours as needed for mild pain, fever or headache. *Do not exceed 2000 mg per day*         . acetaminophen (TYLENOL) 650 MG CR tablet   Oral   Take 650 mg by mouth every 8 (eight) hours.         Marland Kitchen alum & mag hydroxide-simeth (MAALOX PLUS) 400-400-40 MG/5ML suspension   Oral   Take 30 mLs by mouth See admin instructions. Take 30 ml by mouth up to 4 times a day as needed for heartburn/indigestion. *Not to exceed 4 doses in 24 hours*         . aspirin 81 MG chewable tablet   Oral   Chew 81 mg by mouth daily.         Marland Kitchen azelastine (ASTELIN) 0.1 % nasal spray   Each Nare   Place 1 spray into both nostrils 2 (two) times daily as needed for rhinitis. Use in each nostril as directed         . calcium carbonate (OS-CAL) 600  MG TABS tablet   Oral   Take 600 mg by mouth daily. Take at noon.         . Cholecalciferol (EQL VITAMIN D3) 2000 units CAPS   Oral   Take 2,000 Units by mouth daily.         Marland Kitchen donepezil (ARICEPT) 10 MG tablet   Oral   Take 20 mg by mouth at bedtime. *Note dose*         . folic acid (FOLVITE) 1 MG tablet   Oral   Take 1 mg by mouth daily.         Marland Kitchen guaiFENesin (MUCINEX) 600 MG 12 hr tablet   Oral   Take 600 mg by mouth 2 (two) times daily as needed for cough or to loosen phlegm.         Marland Kitchen guaifenesin (ROBAFEN) 100 MG/5ML syrup   Oral   Take 200 mg by mouth every 6 (six) hours as needed for cough. *Not to exceed 4 doses in 24 hours*         . loperamide (IMODIUM) 2 MG capsule   Oral   Take 2 mg by mouth as needed for diarrhea or loose stools. *Do not exceed  8 doses in 24 hrs*         . magnesium hydroxide (MILK OF MAGNESIA) 400 MG/5ML suspension   Oral   Take 30 mLs by mouth at bedtime as needed for mild constipation or moderate constipation.         . mirtazapine (REMERON) 7.5 MG tablet   Oral   Take 7.5 mg by mouth daily.         Marland Kitchen Neomycin-Bacitracin-Polymyxin (TRIPLE ANTIBIOTIC) [phone] OINT   Apply externally   Apply 1 application topically as needed.         . ondansetron (ZOFRAN-ODT) 4 MG disintegrating tablet   Oral   Take 4 mg by mouth every 6 (six) hours as needed for nausea or vomiting.         . pantoprazole (PROTONIX) 40 MG tablet   Oral   Take 40 mg by mouth daily.         . traMADol (ULTRAM) 50 MG tablet   Oral   Take 50 mg by mouth every 6 (six) hours as needed for moderate pain or severe pain.           Allergies Other; Penicillins; Quinolones; and Statins  History reviewed. No pertinent family history.  Social History Social History  Substance Use Topics  . Smoking status: Unknown If Ever Smoked  . Smokeless tobacco: None  . Alcohol Use: No    Review of Systems Limited due to altered mental  status/dementia, these are her answers Constitutional: Negative for fever. Eyes: Negative for visual changes. ENT: Negative for sore throat. Cardiovascular: Negative for chest pain. Respiratory: Negative for shortness of breath. Gastrointestinal: Negative for abdominal pain, vomiting and diarrhea. Genitourinary: Negative for dysuria. Musculoskeletal: Negative for back pain. Skin: Negative for rash. Neurological: Negative for headache. 10 point Review of Systems otherwise negative ____________________________________________   PHYSICAL EXAM:  VITAL SIGNS: ED Triage Vitals  Enc Vitals Group     BP --      Pulse --      Resp --      Temp --      Temp src --      SpO2 --      Weight --      Height --      Head Cir --      Peak Flow --      Pain Score 06/30/15 1611 0     Pain Loc --      Pain Edu? --      Excl. in GC? --      Constitutional: Alert and Cooperative but disoriented. Well appearing and in no distress. HEENT   Head: Normocephalic and atraumatic.      Eyes: Conjunctivae are normal. PERRL. Normal extraocular movements.      Ears:         Nose: No congestion/rhinnorhea.   Mouth/Throat: Mucous membranes are moist.   Neck: No stridor. Cardiovascular/Chest: Normal rate, regular rhythm.  No murmurs, rubs, or gallops. Respiratory: Normal respiratory effort without tachypnea nor retractions. Breath sounds are clear and equal bilaterally. No wheezes/rales/rhonchi. Gastrointestinal: Soft. No distention, no guarding, no rebound. Nontender.    Genitourinary/rectal:Deferred Musculoskeletal: Nontender with normal range of motion in all extremities. No joint effusions.  No lower extremity tenderness.  No edema. Neurologic:  Not oriented to place or time. No slurred speech or focal weakness or numbness appreciated.  Skin:  Skin is warm, dry and intact. No rash noted.   ____________________________________________   EKG Raina Mina,  MD, the attending  physician have personally viewed and interpreted all ECGs.  70 bpm. Normal sinus rhythm. Narrow QRS, left axis deviation. Normal ST and T-wave. ____________________________________________  LABS (pertinent positives/negatives)  Labs Reviewed  BASIC METABOLIC PANEL - Abnormal; Notable for the following:    Glucose, Bld 112 (*)    All other components within normal limits  CBC - Abnormal; Notable for the following:    RDW 14.9 (*)    All other components within normal limits  TROPONIN I    ____________________________________________  RADIOLOGY All Xrays were viewed by me. Imaging interpreted by Radiologist.  Chest two-view:  No evidence of acute cardiopulmonary disease. __________________________________________  PROCEDURES  Procedure(s) performed: None  Critical Care performed: None  ____________________________________________   ED COURSE / ASSESSMENT AND PLAN  Pertinent labs & imaging results that were available during my care of the patient were reviewed by me and considered in my medical decision making (see chart for details).   This dementia patient is well-appearing and does not remember having chest discomfort for which she was sent here for evaluation.   Pelvic the symptoms started just prior to arrival some time around maybe 2 PM. Blood was drawn at 6 PM, almost 4 hours at this point in time and her EKG is nonspecific and she's not been having any discomfort here, and troponin is negative.  Patient has significant dementia and is requiring one-on-one sitter here. I am not highly suspicious for acute coronary syndrome, and I think her initial troponin is adequate for evaluation today.  Patient was uncooperative for orthostatic examination, but she certainly was not having any dizziness or trouble standing.    CONSULTATIONS:   None   Patient / Family / Caregiver informed of clinical course, medical decision-making process, and agree with  plan.    ___________________________________________   FINAL CLINICAL IMPRESSION(S) / ED DIAGNOSES   Final diagnoses:  Chest pain, unspecified chest pain type              Note: This dictation was prepared with Dragon dictation. Any transcriptional errors that result from this process are unintentional   Governor Rooks, MD 06/30/15 1905

## 2015-07-18 ENCOUNTER — Emergency Department
Admission: EM | Admit: 2015-07-18 | Discharge: 2015-07-18 | Disposition: A | Discharge status: Home / Self Care | Payer: Medicare HMO | Attending: Emergency Medicine | Admitting: Emergency Medicine

## 2015-07-18 ENCOUNTER — Encounter: Payer: Self-pay | Admitting: Intensive Care

## 2015-07-18 ENCOUNTER — Emergency Department: Payer: Medicare HMO

## 2015-07-18 DIAGNOSIS — M810 Age-related osteoporosis without current pathological fracture: Secondary | ICD-10-CM | POA: Insufficient documentation

## 2015-07-18 DIAGNOSIS — Z79899 Other long term (current) drug therapy: Secondary | ICD-10-CM | POA: Diagnosis not present

## 2015-07-18 DIAGNOSIS — Y9389 Activity, other specified: Secondary | ICD-10-CM | POA: Diagnosis not present

## 2015-07-18 DIAGNOSIS — S42212A Unspecified displaced fracture of surgical neck of left humerus, initial encounter for closed fracture: Secondary | ICD-10-CM | POA: Diagnosis not present

## 2015-07-18 DIAGNOSIS — R0789 Other chest pain: Secondary | ICD-10-CM | POA: Diagnosis not present

## 2015-07-18 DIAGNOSIS — Y999 Unspecified external cause status: Secondary | ICD-10-CM | POA: Insufficient documentation

## 2015-07-18 DIAGNOSIS — M25512 Pain in left shoulder: Secondary | ICD-10-CM | POA: Diagnosis present

## 2015-07-18 DIAGNOSIS — Y929 Unspecified place or not applicable: Secondary | ICD-10-CM | POA: Insufficient documentation

## 2015-07-18 DIAGNOSIS — N3090 Cystitis, unspecified without hematuria: Secondary | ICD-10-CM | POA: Insufficient documentation

## 2015-07-18 DIAGNOSIS — G309 Alzheimer's disease, unspecified: Secondary | ICD-10-CM | POA: Diagnosis not present

## 2015-07-18 DIAGNOSIS — E785 Hyperlipidemia, unspecified: Secondary | ICD-10-CM | POA: Insufficient documentation

## 2015-07-18 DIAGNOSIS — M069 Rheumatoid arthritis, unspecified: Secondary | ICD-10-CM | POA: Insufficient documentation

## 2015-07-18 DIAGNOSIS — Z7982 Long term (current) use of aspirin: Secondary | ICD-10-CM | POA: Diagnosis not present

## 2015-07-18 DIAGNOSIS — I10 Essential (primary) hypertension: Secondary | ICD-10-CM | POA: Insufficient documentation

## 2015-07-18 DIAGNOSIS — W1839XA Other fall on same level, initial encounter: Secondary | ICD-10-CM | POA: Diagnosis not present

## 2015-07-18 HISTORY — DX: Psoriasis, unspecified: L40.9

## 2015-07-18 LAB — URINALYSIS COMPLETE WITH MICROSCOPIC (ARMC ONLY)
Bilirubin Urine: NEGATIVE
Glucose, UA: NEGATIVE mg/dL
Hgb urine dipstick: NEGATIVE
Ketones, ur: NEGATIVE mg/dL
NITRITE: POSITIVE — AB
PH: 6 (ref 5.0–8.0)
PROTEIN: NEGATIVE mg/dL
Specific Gravity, Urine: 1.013 (ref 1.005–1.030)

## 2015-07-18 LAB — BASIC METABOLIC PANEL
Anion gap: 10 (ref 5–15)
BUN: 15 mg/dL (ref 6–20)
CALCIUM: 9.1 mg/dL (ref 8.9–10.3)
CO2: 28 mmol/L (ref 22–32)
CREATININE: 0.68 mg/dL (ref 0.44–1.00)
Chloride: 103 mmol/L (ref 101–111)
Glucose, Bld: 131 mg/dL — ABNORMAL HIGH (ref 65–99)
Potassium: 3.5 mmol/L (ref 3.5–5.1)
SODIUM: 141 mmol/L (ref 135–145)

## 2015-07-18 LAB — CBC WITH DIFFERENTIAL/PLATELET
BASOS PCT: 1 %
Basophils Absolute: 0 10*3/uL (ref 0–0.1)
EOS ABS: 0.2 10*3/uL (ref 0–0.7)
Eosinophils Relative: 2 %
HEMATOCRIT: 38.2 % (ref 35.0–47.0)
HEMOGLOBIN: 12.7 g/dL (ref 12.0–16.0)
Lymphocytes Relative: 32 %
Lymphs Abs: 2.5 10*3/uL (ref 1.0–3.6)
MCH: 29.9 pg (ref 26.0–34.0)
MCHC: 33.3 g/dL (ref 32.0–36.0)
MCV: 89.8 fL (ref 80.0–100.0)
Monocytes Absolute: 0.6 10*3/uL (ref 0.2–0.9)
Monocytes Relative: 7 %
NEUTROS PCT: 58 %
Neutro Abs: 4.5 10*3/uL (ref 1.4–6.5)
Platelets: 173 10*3/uL (ref 150–440)
RBC: 4.26 MIL/uL (ref 3.80–5.20)
RDW: 15.1 % — AB (ref 11.5–14.5)
WBC: 7.8 10*3/uL (ref 3.6–11.0)

## 2015-07-18 MED ORDER — CEPHALEXIN 500 MG PO CAPS: 500.0000 mg | ORAL_CAPSULE | Freq: Two times a day (BID) | ORAL | Status: AC

## 2015-07-18 MED ORDER — DEXTROSE 5 % IV SOLN
1.0000 g | Freq: Once | INTRAVENOUS | Status: AC
  Administered 2015-07-18: 1 g via INTRAVENOUS
  Filled 2015-07-18: qty 10

## 2015-07-18 NOTE — ED Provider Notes (Signed)
[facility] Emergency Department Provider Note  ____________________________________________  Time seen: 12:05 PM  I have reviewed the triage vital signs and the nursing notes.   HISTORY  Chief Complaint Fall Level 5 caveat:  Portions of the history and physical were unable to be obtained due to the patient's chronic dementia    HPI Amanda Watkins is a 82 y.o. female sent to the ED from [facility] for F fall. Staff report that the patient was trying to sit down but there is no chair behind her and so she just sat herself into a fall onto the ground. No head injury. No loss of consciousness. Complains of pain in the left shoulder that hurts with movement. Otherwise in her usual state of health. Patient denies any other acute complaints. No other complaints or symptoms reported by staff. Eating and drinking normally.     Past Medical History  Diagnosis Date  . Hypertension   . Alzheimer disease   . Rheumatoid arthritis (HCC)   . Hyperlipidemia   . Osteoporosis   . Rheumatoid arthritis (HCC)   . Psoriasis      There are no active problems to display for this patient.    History reviewed. No pertinent past surgical history.   Current Outpatient Rx  Name  Route  Sig  Dispense  Refill  . acetaminophen (TYLENOL) 500 MG tablet   Oral   Take 500 mg by mouth every 4 (four) hours as needed for mild pain, fever or headache. *Do not exceed 2000 mg per day*         . acetaminophen (TYLENOL) 650 MG CR tablet   Oral   Take 650 mg by mouth every 8 (eight) hours.         Marland Kitchen alum & mag hydroxide-simeth (MAALOX PLUS) 400-400-40 MG/5ML suspension   Oral   Take 30 mLs by mouth See admin instructions. Take 30 ml by mouth up to 4 times a day as needed for heartburn/indigestion. *Not to exceed 4 doses in 24 hours*         . aspirin 81 MG chewable tablet   Oral   Chew 81 mg by mouth daily.         Marland Kitchen azelastine (ASTELIN) 0.1 % nasal spray    Each Nare   Place 1 spray into both nostrils 2 (two) times daily as needed for rhinitis. Use in each nostril as directed         . calcium carbonate (OS-CAL) 600 MG TABS tablet   Oral   Take 600 mg by mouth daily. Take at noon.         . cephALEXin (KEFLEX) 500 MG capsule   Oral   Take 1 capsule (500 mg total) by mouth 2 (two) times daily.   14 capsule   0   . Cholecalciferol (EQL VITAMIN D3) 2000 units CAPS   Oral   Take 2,000 Units by mouth daily.         Marland Kitchen donepezil (ARICEPT) 10 MG tablet   Oral   Take 20 mg by mouth at bedtime. *Note dose*         . folic acid (FOLVITE) 1 MG tablet   Oral   Take 1 mg by mouth daily.         Marland Kitchen guaiFENesin (MUCINEX) 600 MG 12 hr tablet   Oral   Take 600 mg by mouth 2 (two) times daily as needed for cough or to loosen phlegm.         Marland Kitchen  guaifenesin (ROBAFEN) 100 MG/5ML syrup   Oral   Take 200 mg by mouth every 6 (six) hours as needed for cough. *Not to exceed 4 doses in 24 hours*         . loperamide (IMODIUM) 2 MG capsule   Oral   Take 2 mg by mouth as needed for diarrhea or loose stools. *Do not exceed 8 doses in 24 hrs*         . magnesium hydroxide (MILK OF MAGNESIA) 400 MG/5ML suspension   Oral   Take 30 mLs by mouth at bedtime as needed for mild constipation or moderate constipation.         . mirtazapine (REMERON) 7.5 MG tablet   Oral   Take 7.5 mg by mouth daily.         Marland Kitchen Neomycin-Bacitracin-Polymyxin (TRIPLE ANTIBIOTIC) [phone] OINT   Apply externally   Apply 1 application topically as needed.         . ondansetron (ZOFRAN-ODT) 4 MG disintegrating tablet   Oral   Take 4 mg by mouth every 6 (six) hours as needed for nausea or vomiting.         . pantoprazole (PROTONIX) 40 MG tablet   Oral   Take 40 mg by mouth daily.         . traMADol (ULTRAM) 50 MG tablet   Oral   Take 50 mg by mouth every 6 (six) hours as needed for moderate pain or severe pain.             Allergies Bisphosphonates; Other; Penicillins; Quinolones; and Statins   History reviewed. No pertinent family history.  Social History Social History  Substance Use Topics  . Smoking status: Unknown If Ever Smoked  . Smokeless tobacco: None  . Alcohol Use: No    Review of Systems Unable to obtain reliably due to dementia  ____________________________________________   PHYSICAL EXAM:  VITAL SIGNS: ED Triage Vitals  Enc Vitals Group     BP 07/18/15 1200 187/87 mmHg     Pulse Rate 07/18/15 1203 68     Resp 07/18/15 1203 16     Temp 07/18/15 1203 97.9 F (36.6 C)     Temp Source 07/18/15 1203 Oral     SpO2 07/18/15 1203 100 %     Weight 07/18/15 1203 137 lb 1.6 oz (62.188 kg)     Height --      Head Cir --      Peak Flow --      Pain Score --      Pain Loc --      Pain Edu? --      Excl. in GC? --     Vital signs reviewed, nursing assessments reviewed.   Constitutional:   Alert and orientedTo person and place. Well appearing and in no distress. Eyes:   No scleral icterus. No conjunctival pallor. PERRL. EOMI.  No nystagmus. ENT   Head:   Normocephalic and atraumatic.   Nose:   No congestion/rhinnorhea. No septal hematoma   Mouth/Throat:   MMM, no pharyngeal erythema. No peritonsillar mass.    Neck:   No stridor. No SubQ emphysema. No meningismus. Hematological/Lymphatic/Immunilogical:   No cervical lymphadenopathy. Cardiovascular:   RRR. Symmetric bilateral radial and DP pulses.  No murmurs.  Respiratory:   Normal respiratory effort without tachypnea nor retractions. Breath sounds are clear and equal bilaterally. No wheezes/rales/rhonchi. Gastrointestinal:   Soft and nontender. Non distended. There is no CVA tenderness.  No rebound,  rigidity, or guarding. Genitourinary:   deferred Musculoskeletal:   Tenderness of the left anterior superior chest wall and over the left acromion. No bony focal tenderness, no instability or crepitus. No  ecchymosis. Full range of motion in all joints Neurologic:   Normal speech and language.  CN 2-10 normal. Motor grossly intact. No gross focal neurologic deficits are appreciated.  Skin:    Skin is warm, dry and intact. No rash noted.  No petechiae, purpura, or bullae.  ____________________________________________    LABS (pertinent positives/negatives) (all labs ordered are listed, but only abnormal results are displayed) Labs Reviewed  URINALYSIS COMPLETEWITH MICROSCOPIC ([facility] ONLY) - Abnormal; Notable for the following:    Color, Urine YELLOW (*)    APPearance HAZY (*)    Nitrite POSITIVE (*)    Leukocytes, UA TRACE (*)    Bacteria, UA MANY (*)    Squamous Epithelial / LPF 0-5 (*)    All other components within normal limits  BASIC METABOLIC PANEL - Abnormal; Notable for the following:    Glucose, Bld 131 (*)    All other components within normal limits  CBC WITH DIFFERENTIAL/PLATELET - Abnormal; Notable for the following:    RDW 15.1 (*)    All other components within normal limits   ____________________________________________   EKG  Interpreted by me Sinus rhythm rate of 79, left axis, normal intervals. Normal QRS ST segments and T waves  ____________________________________________    RADIOLOGY  X-ray chest except for humeral neck fracture X-ray left shoulder reveals acute left humeral neck fracture  ____________________________________________   PROCEDURES   ____________________________________________   INITIAL IMPRESSION / ASSESSMENT AND PLAN / ED COURSE  Pertinent labs & imaging results that were available during my care of the patient were reviewed by me and considered in my medical decision making (see chart for details).  Patient well appearing no acute distress presents with fall. Found to have UTI. Labs unremarkable, x-ray reveals humeral neck fracture. We'll place in sling. Follow-up with PCP and orthopedics. Cephalexin given in ED,  continue Keflex for UTI treatment..     ____________________________________________   FINAL CLINICAL IMPRESSION(S) / ED DIAGNOSES  Final diagnoses:  Cystitis  Left-sided chest wall pain       Portions of this note were generated with dragon dictation software. Dictation errors may occur despite best attempts at proofreading.   Sharman Cheek, MD 07/18/15 [phone]

## 2015-07-18 NOTE — ED Notes (Signed)
Pt arrived by EMS from [facility]. Staff reports patient tried to sit down when there was no chair present and fell to the ground. Pt c/o L shoulder pain and has limited movement to it. EMS vitals 189/76, 69HR, 98 room air. Pt has HX dementia. Pt A&O x4

## 2015-07-18 NOTE — Discharge Instructions (Signed)
Chest Wall Pain Chest wall pain is pain in or around the bones and muscles of your chest. Sometimes, an injury causes this pain. Sometimes, the cause may not be known. This pain may take several weeks or longer to get better. HOME CARE INSTRUCTIONS  Pay attention to any changes in your symptoms. Take these actions to help with your pain:   Rest as told by your health care provider.   Avoid activities that cause pain. These include any activities that use your chest muscles or your abdominal and side muscles to lift heavy items.   If directed, apply ice to the painful area:  Put ice in a plastic bag.  Place a towel between your skin and the bag.  Leave the ice on for 20 minutes, 2-3 times per day.  Take over-the-counter and prescription medicines only as told by your health care provider.  Do not use tobacco products, including cigarettes, chewing tobacco, and e-cigarettes. If you need help quitting, ask your health care provider.  Keep all follow-up visits as told by your health care provider. This is important. SEEK MEDICAL CARE IF:  You have a fever.  Your chest pain becomes worse.  You have new symptoms. SEEK IMMEDIATE MEDICAL CARE IF:  You have nausea or vomiting.  You feel sweaty or light-headed.  You have a cough with phlegm (sputum) or you cough up blood.  You develop shortness of breath.   This information is not intended to replace advice given to you by your health care provider. Make sure you discuss any questions you have with your health care provider.   Document Released: 01/05/2005 Document Revised: 09/26/2014 Document Reviewed: 04/02/2014 Elsevier Interactive Patient Education 2016 [employer]  Urinary Tract Infection Urinary tract infections (UTIs) can develop anywhere along your urinary tract. Your urinary tract is your body's drainage system for removing wastes and extra water. Your urinary tract includes two kidneys, two ureters, a bladder, and a  urethra. Your kidneys are a pair of bean-shaped organs. Each kidney is about the size of your fist. They are located below your ribs, one on each side of your spine. CAUSES Infections are caused by microbes, which are microscopic organisms, including fungi, viruses, and bacteria. These organisms are so small that they can only be seen through a microscope. Bacteria are the microbes that most commonly cause UTIs. SYMPTOMS  Symptoms of UTIs may vary by age and gender of the patient and by the location of the infection. Symptoms in young women typically include a frequent and intense urge to urinate and a painful, burning feeling in the bladder or urethra during urination. Older women and men are more likely to be tired, shaky, and weak and have muscle aches and abdominal pain. A fever may mean the infection is in your kidneys. Other symptoms of a kidney infection include pain in your back or sides below the ribs, nausea, and vomiting. DIAGNOSIS To diagnose a UTI, your caregiver will ask you about your symptoms. Your caregiver will also ask you to provide a urine sample. The urine sample will be tested for bacteria and white blood cells. White blood cells are made by your body to help fight infection. TREATMENT  Typically, UTIs can be treated with medication. Because most UTIs are caused by a bacterial infection, they usually can be treated with the use of antibiotics. The choice of antibiotic and length of treatment depend on your symptoms and the type of bacteria causing your infection. HOME CARE INSTRUCTIONS  If  you were prescribed antibiotics, take them exactly as your caregiver instructs you. Finish the medication even if you feel better after you have only taken some of the medication.  Drink enough water and fluids to keep your urine clear or pale yellow.  Avoid caffeine, tea, and carbonated beverages. They tend to irritate your bladder.  Empty your bladder often. Avoid holding urine for long  periods of time.  Empty your bladder before and after sexual intercourse.  After a bowel movement, women should cleanse from front to back. Use each tissue only once. SEEK MEDICAL CARE IF:   You have back pain.  You develop a fever.  Your symptoms do not begin to resolve within 3 days. SEEK IMMEDIATE MEDICAL CARE IF:   You have severe back pain or lower abdominal pain.  You develop chills.  You have nausea or vomiting.  You have continued burning or discomfort with urination. MAKE SURE YOU:   Understand these instructions.  Will watch your condition.  Will get help right away if you are not doing well or get worse.   This information is not intended to replace advice given to you by your health care provider. Make sure you discuss any questions you have with your health care provider.   Document Released: 10/15/2004 Document Revised: 09/26/2014 Document Reviewed: 02/13/2011 Elsevier Interactive Patient Education 2016 Else[employer] Inc[employer]  Humerus Fracture Treated With Immobilization The humerus is the large bone in your upper arm. You have a broken (fractured) humerus. These fractures are easily diagnosed with X-rays. TREATMENT  Simple fractures which will heal without disability are treated with simple immobilization. Immobilization means you will wear a cast, splint, or sling. You have a fracture which will do well with immobilization. The fracture will heal well simply by being held in a good position until it is stable enough to begin range of motion exercises. Do not take part in activities which would further injure your arm.  HOME CARE INSTRUCTIONS   Put ice on the injured area.  Put ice in a plastic bag.  Place a towel between your skin and the bag.  Leave the ice on for 15-20 minutes, 03-04 times a day.  If you have a cast:  Do not scratch the skin under the cast using sharp or pointed objects.  Check the skin around the cast every day. You may put lotion on any  red or sore areas.  Keep your cast dry and clean.  If you have a splint:  Wear the splint as directed.  Keep your splint dry and clean.  You may loosen the elastic around the splint if your fingers become numb, tingle, or turn cold or blue.  If you have a sling:  Wear the sling as directed.  Do not put pressure on any part of your cast or splint until it is fully hardened.  Your cast or splint can be protected during bathing with a plastic bag. Do not lower the cast or splint into water.  Only take over-the-counter or prescription medicines for pain, discomfort, or fever as directed by your caregiver.  Do range of motion exercises as instructed by your caregiver.  Follow up as directed by your caregiver. This is very important in order to avoid permanent injury or disability and chronic pain. SEEK IMMEDIATE MEDICAL CARE IF:   Your skin or nails in the injured arm turn blue or gray.  Your arm feels cold or numb.  You develop severe pain in the injured arm.  You are having problems with the medicines you were given. MAKE SURE YOU:   Understand these instructions.  Will watch your condition.  Will get help right away if you are not doing well or get worse.   This information is not intended to replace advice given to you by your health care provider. Make sure you discuss any questions you have with your health care provider.   Document Released: 04/13/2000 Document Revised: 01/26/2014 Document Reviewed: 05/30/2014 Elsevier Interactive Patient Education [employer]

## 2015-07-19 ENCOUNTER — Emergency Department
Admission: EM | Admit: 2015-07-19 | Discharge: 2015-07-19 | Disposition: A | Discharge status: Home / Self Care | Payer: Medicare HMO | Attending: Emergency Medicine | Admitting: Emergency Medicine

## 2015-07-19 ENCOUNTER — Encounter: Payer: Self-pay | Admitting: Emergency Medicine

## 2015-07-19 DIAGNOSIS — Z79899 Other long term (current) drug therapy: Secondary | ICD-10-CM | POA: Insufficient documentation

## 2015-07-19 DIAGNOSIS — E785 Hyperlipidemia, unspecified: Secondary | ICD-10-CM | POA: Diagnosis not present

## 2015-07-19 DIAGNOSIS — M069 Rheumatoid arthritis, unspecified: Secondary | ICD-10-CM | POA: Diagnosis not present

## 2015-07-19 DIAGNOSIS — M810 Age-related osteoporosis without current pathological fracture: Secondary | ICD-10-CM | POA: Diagnosis not present

## 2015-07-19 DIAGNOSIS — I10 Essential (primary) hypertension: Secondary | ICD-10-CM | POA: Diagnosis not present

## 2015-07-19 DIAGNOSIS — W19XXXA Unspecified fall, initial encounter: Secondary | ICD-10-CM

## 2015-07-19 DIAGNOSIS — G309 Alzheimer's disease, unspecified: Secondary | ICD-10-CM | POA: Insufficient documentation

## 2015-07-19 DIAGNOSIS — Z048 Encounter for examination and observation for other specified reasons: Secondary | ICD-10-CM | POA: Diagnosis present

## 2015-07-19 DIAGNOSIS — Z7982 Long term (current) use of aspirin: Secondary | ICD-10-CM | POA: Insufficient documentation

## 2015-07-19 NOTE — ED Provider Notes (Signed)
[facility] Emergency Department Provider Note   ____________________________________________  Time seen: Seen upon arrival to the emergency department.    I have reviewed the triage vital signs and the nursing notes.   HISTORY  Chief Complaint Fall Level V caveat secondary to patient's dementia.  HPI Amanda Watkins is a 82 y.o. female with a history of dementia who was witnessed sliding off the side of her bed onto her bottom this evening by staff. Per the policy they had to call EMS for further evaluation. The patient was not witnessed to hit her head or lose consciousness. She is able to ambulate with EMS. She has no complaints at this time.   Past Medical History  Diagnosis Date  . Hypertension   . Alzheimer disease   . Rheumatoid arthritis (HCC)   . Hyperlipidemia   . Osteoporosis   . Rheumatoid arthritis (HCC)   . Psoriasis     There are no active problems to display for this patient.   History reviewed. No pertinent past surgical history.  Current Outpatient Rx  Name  Route  Sig  Dispense  Refill  . acetaminophen (TYLENOL) 500 MG tablet   Oral   Take 500 mg by mouth every 4 (four) hours as needed for mild pain, fever or headache. *Do not exceed 2000 mg per day*         . acetaminophen (TYLENOL) 650 MG CR tablet   Oral   Take 650 mg by mouth every 8 (eight) hours.         Marland Kitchen alum & mag hydroxide-simeth (MAALOX PLUS) 400-400-40 MG/5ML suspension   Oral   Take 30 mLs by mouth See admin instructions. Take 30 ml by mouth up to 4 times a day as needed for heartburn/indigestion. *Not to exceed 4 doses in 24 hours*         . aspirin 81 MG chewable tablet   Oral   Chew 81 mg by mouth daily.         Marland Kitchen azelastine (ASTELIN) 0.1 % nasal spray   Each Nare   Place 1 spray into both nostrils 2 (two) times daily as needed for rhinitis. Use in each nostril as directed         . calcium carbonate (OS-CAL) 600 MG TABS tablet  Oral   Take 600 mg by mouth daily. Take at noon.         . cephALEXin (KEFLEX) 500 MG capsule   Oral   Take 1 capsule (500 mg total) by mouth 2 (two) times daily.   14 capsule   0   . Cholecalciferol (EQL VITAMIN D3) 2000 units CAPS   Oral   Take 2,000 Units by mouth daily.         Marland Kitchen donepezil (ARICEPT) 10 MG tablet   Oral   Take 20 mg by mouth at bedtime. *Note dose*         . folic acid (FOLVITE) 1 MG tablet   Oral   Take 1 mg by mouth daily.         Marland Kitchen guaiFENesin (MUCINEX) 600 MG 12 hr tablet   Oral   Take 600 mg by mouth 2 (two) times daily as needed for cough or to loosen phlegm.         Marland Kitchen guaifenesin (ROBAFEN) 100 MG/5ML syrup   Oral   Take 200 mg by mouth every 6 (six) hours as needed for cough. *Not to exceed 4 doses in 24  hours*         . loperamide (IMODIUM) 2 MG capsule   Oral   Take 2 mg by mouth as needed for diarrhea or loose stools. *Do not exceed 8 doses in 24 hrs*         . magnesium hydroxide (MILK OF MAGNESIA) 400 MG/5ML suspension   Oral   Take 30 mLs by mouth at bedtime as needed for mild constipation or moderate constipation.         . mirtazapine (REMERON) 7.5 MG tablet   Oral   Take 7.5 mg by mouth daily.         Marland Kitchen Neomycin-Bacitracin-Polymyxin (TRIPLE ANTIBIOTIC) [phone] OINT   Apply externally   Apply 1 application topically as needed.         . ondansetron (ZOFRAN-ODT) 4 MG disintegrating tablet   Oral   Take 4 mg by mouth every 6 (six) hours as needed for nausea or vomiting.         . pantoprazole (PROTONIX) 40 MG tablet   Oral   Take 40 mg by mouth daily.         . traMADol (ULTRAM) 50 MG tablet   Oral   Take 50 mg by mouth every 6 (six) hours as needed for moderate pain or severe pain.           Allergies Bisphosphonates; Other; Penicillins; Quinolones; and Statins  No family history on file.  Social History Social History  Substance Use Topics  . Smoking status: Unknown If Ever Smoked  .  Smokeless tobacco: None  . Alcohol Use: No    Review of Systems Constitutional: No fever/chills Eyes: No visual changes. ENT: No sore throat. Cardiovascular: Denies chest pain. Respiratory: Denies shortness of breath. Gastrointestinal: No abdominal pain.  No nausea, no vomiting.  No diarrhea.  No constipation. Genitourinary: Negative for dysuria. Musculoskeletal: Negative for back pain. Left arm pain. Skin: Negative for rash. Neurological: Negative for headaches, focal weakness or numbness.  10-point ROS otherwise negative. However, history is slightly confounded by the patient's dementia.  ____________________________________________   PHYSICAL EXAM:  VITAL SIGNS: ED Triage Vitals  Enc Vitals Group     BP 07/19/15 2056 187/86 mmHg     Pulse Rate 07/19/15 2056 72     Resp 07/19/15 2056 14     Temp 07/19/15 2056 98.2 F (36.8 C)     Temp Source 07/19/15 2056 Oral     SpO2 07/19/15 2056 96 %     Weight --      Height --      Head Cir --      Peak Flow --      Pain Score --      Pain Loc --      Pain Edu? --      Excl. in GC? --     Constitutional: Alert and oriented. Well appearing and in no acute distress. Eyes: Conjunctivae are normal. PERRL. EOMI. Head: Atraumatic. Nose: No congestion/rhinnorhea. Mouth/Throat: Mucous membranes are moist.   Neck: No stridor.  No tenderness palpation of the midline C-spine. Patient ranges her neck freely. Cardiovascular: Normal rate, regular rhythm. Grossly normal heart sounds.   Respiratory: Normal respiratory effort.  No retractions. Lungs CTAB. Gastrointestinal: Soft and nontender. No distention. No abdominal bruits. No CVA tenderness. Musculoskeletal: No lower extremity tenderness nor edema.  No joint effusions.  Left upper extremity in shoulder immobilizer.  Neurovascularly intact distally. No tenderness to the cervical, thoracic or lumbar spines. Patient  is able to ambulate with assistance. She walks her baseline with a  walker. No tenderness over the pelvis of the coccyx. Neurologic:  Normal speech and language. No gross focal neurologic deficits are appreciated.  Skin:  Skin is warm, dry and intact. No rash noted. Psychiatric: Mood and affect are normal. Speech and behavior are normal.  ____________________________________________   LABS (all labs ordered are listed, but only abnormal results are displayed)  Labs Reviewed - No data to display ____________________________________________  EKG   ____________________________________________  RADIOLOGY   ____________________________________________   PROCEDURES    ____________________________________________   INITIAL IMPRESSION / ASSESSMENT AND PLAN / ED COURSE  Pertinent labs & imaging results that were available during my care of the patient were reviewed by me and considered in my medical decision making (see chart for details).  Patient with mechanical fall without any obvious evidence of sequela. Will be discharged back to her dementia unit. The [facility] dementia unit so that they will be supervising her closely overnight. ____________________________________________   FINAL CLINICAL IMPRESSION(S) / ED DIAGNOSES  Witnessed mechanical fall.    NEW MEDICATIONS STARTED DURING THIS VISIT:  New Prescriptions   No medications on file     Note:  This document was prepared using Dragon voice recognition software and may include unintentional dictation errors.    [name], MD 07/19/15 2113

## 2015-07-19 NOTE — ED Notes (Addendum)
Pt to ED via EMS from [place] [facility] initally called out for fall. Per EMS staff says she slid down the bed, no fall or LOC, or hitting head. Pt up to stretcher for EMS. Pt hx of dementia. Pt alert . Per EMS staff states that she will spend the night at the nurses desk tonight to be near nurse.

## 2015-07-19 NOTE — ED Notes (Addendum)
Pt up and ambulatory short ways with MD at bedside. Pt denies any pain. Pt pleasant at this time

## 2015-07-20 NOTE — ED Provider Notes (Signed)
Patient's care facility called, noted that the patient is allergic to cephalexin. This prescription was stopped, and I have written the patient for prescription of Macrobid for UTI.  Sharyn Creamer, MD 07/20/15 [phone]

## 2015-07-24 ENCOUNTER — Encounter: Payer: Self-pay | Admitting: Emergency Medicine

## 2015-07-24 ENCOUNTER — Emergency Department
Admission: EM | Admit: 2015-07-24 | Discharge: 2015-07-24 | Disposition: A | Discharge status: Home / Self Care | Payer: Medicare HMO | Attending: Emergency Medicine | Admitting: Emergency Medicine

## 2015-07-24 ENCOUNTER — Emergency Department: Payer: Medicare HMO

## 2015-07-24 DIAGNOSIS — Y999 Unspecified external cause status: Secondary | ICD-10-CM | POA: Insufficient documentation

## 2015-07-24 DIAGNOSIS — E785 Hyperlipidemia, unspecified: Secondary | ICD-10-CM | POA: Diagnosis not present

## 2015-07-24 DIAGNOSIS — G309 Alzheimer's disease, unspecified: Secondary | ICD-10-CM | POA: Diagnosis not present

## 2015-07-24 DIAGNOSIS — M810 Age-related osteoporosis without current pathological fracture: Secondary | ICD-10-CM | POA: Diagnosis not present

## 2015-07-24 DIAGNOSIS — W19XXXA Unspecified fall, initial encounter: Secondary | ICD-10-CM | POA: Insufficient documentation

## 2015-07-24 DIAGNOSIS — Y929 Unspecified place or not applicable: Secondary | ICD-10-CM | POA: Insufficient documentation

## 2015-07-24 DIAGNOSIS — Z79899 Other long term (current) drug therapy: Secondary | ICD-10-CM | POA: Diagnosis not present

## 2015-07-24 DIAGNOSIS — Z048 Encounter for examination and observation for other specified reasons: Secondary | ICD-10-CM | POA: Diagnosis present

## 2015-07-24 DIAGNOSIS — I10 Essential (primary) hypertension: Secondary | ICD-10-CM | POA: Insufficient documentation

## 2015-07-24 DIAGNOSIS — Y939 Activity, unspecified: Secondary | ICD-10-CM | POA: Insufficient documentation

## 2015-07-24 DIAGNOSIS — Z7982 Long term (current) use of aspirin: Secondary | ICD-10-CM | POA: Diagnosis not present

## 2015-07-24 DIAGNOSIS — M069 Rheumatoid arthritis, unspecified: Secondary | ICD-10-CM | POA: Insufficient documentation

## 2015-07-24 LAB — BASIC METABOLIC PANEL
ANION GAP: 8 (ref 5–15)
BUN: 17 mg/dL (ref 6–20)
CALCIUM: 9.1 mg/dL (ref 8.9–10.3)
CHLORIDE: 105 mmol/L (ref 101–111)
CO2: 33 mmol/L — AB (ref 22–32)
Creatinine, Ser: 0.54 mg/dL (ref 0.44–1.00)
GFR calc non Af Amer: >60 mL/min (ref >60)
Glucose, Bld: 106 mg/dL — ABNORMAL HIGH (ref 65–99)
POTASSIUM: 3.3 mmol/L — AB (ref 3.5–5.1)
SODIUM: 146 mmol/L — AB (ref 135–145)

## 2015-07-24 LAB — CBC WITH DIFFERENTIAL/PLATELET
BASOS ABS: 0.1 10*3/uL (ref 0–0.1)
BASOS PCT: 1 %
EOS PCT: 3 %
Eosinophils Absolute: 0.2 10*3/uL (ref 0–0.7)
HEMATOCRIT: 35.3 % (ref 35.0–47.0)
Hemoglobin: 12.3 g/dL (ref 12.0–16.0)
LYMPHS PCT: 16 %
Lymphs Abs: 1.4 10*3/uL (ref 1.0–3.6)
MCH: 30.6 pg (ref 26.0–34.0)
MCHC: 34.8 g/dL (ref 32.0–36.0)
MCV: 88 fL (ref 80.0–100.0)
MONO ABS: 0.7 10*3/uL (ref 0.2–0.9)
MONOS PCT: 8 %
NEUTROS ABS: 6.5 10*3/uL (ref 1.4–6.5)
Neutrophils Relative %: 72 %
PLATELETS: 192 10*3/uL (ref 150–440)
RBC: 4.01 MIL/uL (ref 3.80–5.20)
RDW: 14.5 % (ref 11.5–14.5)
WBC: 8.9 10*3/uL (ref 3.6–11.0)

## 2015-07-24 LAB — URINALYSIS COMPLETE WITH MICROSCOPIC (ARMC ONLY)
Bilirubin Urine: NEGATIVE
GLUCOSE, UA: NEGATIVE mg/dL
Hgb urine dipstick: NEGATIVE
Ketones, ur: NEGATIVE mg/dL
Leukocytes, UA: NEGATIVE
NITRITE: NEGATIVE
PROTEIN: NEGATIVE mg/dL
Specific Gravity, Urine: 1.019 (ref 1.005–1.030)
pH: 6 (ref 5.0–8.0)

## 2015-07-24 LAB — CK: Total CK: 450 U/L — ABNORMAL HIGH (ref 38–234)

## 2015-07-24 LAB — TROPONIN I: Troponin I: <0.03 ng/mL (ref <0.03)

## 2015-07-24 MED ORDER — SODIUM CHLORIDE 0.9 % IV BOLUS (SEPSIS)
1000.0000 mL | Freq: Once | INTRAVENOUS | Status: AC
  Administered 2015-07-24: 1000 mL via INTRAVENOUS

## 2015-07-24 MED ORDER — ACETAMINOPHEN 500 MG PO TABS
1000.0000 mg | ORAL_TABLET | Freq: Once | ORAL | Status: AC
  Administered 2015-07-24: 1000 mg via ORAL
  Filled 2015-07-24: qty 2

## 2015-07-24 NOTE — ED Provider Notes (Signed)
[facility] Emergency Department Provider Note  ____________________________________________  Time seen: Approximately 8:26 AM  I have reviewed the triage vital signs and the nursing notes.   HISTORY  Chief Complaint Fall   HPI Amanda Watkins is a 82 y.o. female with a history of hypertension, Alzheimer's, RA, OA, and psoriasis who presents for evaluation after unwitnessed fall. Patient has had multiple prior visits to the emergency department for falls. She was found down in her facility, alert and oriented, baseline, sitting on the carpet next to her recliner. Patient has no recollection of the fall. Patient has no complaints at this time and denies headache, neck pain, back pain, chest pain, shortness of breath, abdominal pain, pain in her extremities, dysuria. Patient does have a walker at home. She does not take any blood thinners. History is limited due to patient's advanced dementia  Past Medical History  Diagnosis Date  . Hypertension   . Alzheimer disease   . Rheumatoid arthritis (HCC)   . Hyperlipidemia   . Osteoporosis   . Rheumatoid arthritis (HCC)   . Psoriasis     There are no active problems to display for this patient.   History reviewed. No pertinent past surgical history.  Current Outpatient Rx  Name  Route  Sig  Dispense  Refill  . acetaminophen (TYLENOL) 500 MG tablet   Oral   Take 500 mg by mouth every 4 (four) hours as needed for mild pain, fever or headache. *Do not exceed 2000 mg per day*         . acetaminophen (TYLENOL) 650 MG CR tablet   Oral   Take 650 mg by mouth every 8 (eight) hours.         Marland Kitchen alum & mag hydroxide-simeth (MAALOX PLUS) 400-400-40 MG/5ML suspension   Oral   Take 30 mLs by mouth See admin instructions. Take 30 ml by mouth up to 4 times a day as needed for heartburn/indigestion. *Not to exceed 4 doses in 24 hours*         . aspirin 81 MG chewable tablet   Oral   Chew 81 mg by mouth  daily.         Marland Kitchen azelastine (ASTELIN) 0.1 % nasal spray   Each Nare   Place 1 spray into both nostrils 2 (two) times daily as needed for rhinitis. Use in each nostril as directed         . calcium carbonate (OS-CAL) 600 MG TABS tablet   Oral   Take 600 mg by mouth daily. Take at noon.         . cephALEXin (KEFLEX) 500 MG capsule   Oral   Take 1 capsule (500 mg total) by mouth 2 (two) times daily.   14 capsule   0   . Cholecalciferol (EQL VITAMIN D3) 2000 units CAPS   Oral   Take 2,000 Units by mouth daily.         Marland Kitchen donepezil (ARICEPT) 10 MG tablet   Oral   Take 20 mg by mouth at bedtime. *Note dose*         . folic acid (FOLVITE) 1 MG tablet   Oral   Take 1 mg by mouth daily.         Marland Kitchen guaiFENesin (MUCINEX) 600 MG 12 hr tablet   Oral   Take 600 mg by mouth 2 (two) times daily as needed for cough or to loosen phlegm.         Marland Kitchen  guaifenesin (ROBAFEN) 100 MG/5ML syrup   Oral   Take 200 mg by mouth every 6 (six) hours as needed for cough. *Not to exceed 4 doses in 24 hours*         . loperamide (IMODIUM) 2 MG capsule   Oral   Take 2 mg by mouth as needed for diarrhea or loose stools. *Do not exceed 8 doses in 24 hrs*         . magnesium hydroxide (MILK OF MAGNESIA) 400 MG/5ML suspension   Oral   Take 30 mLs by mouth at bedtime as needed for mild constipation or moderate constipation.         . mirtazapine (REMERON) 7.5 MG tablet   Oral   Take 7.5 mg by mouth daily.         Marland Kitchen Neomycin-Bacitracin-Polymyxin (TRIPLE ANTIBIOTIC) [phone] OINT   Apply externally   Apply 1 application topically as needed.         . ondansetron (ZOFRAN-ODT) 4 MG disintegrating tablet   Oral   Take 4 mg by mouth every 6 (six) hours as needed for nausea or vomiting.         . pantoprazole (PROTONIX) 40 MG tablet   Oral   Take 40 mg by mouth daily.         . traMADol (ULTRAM) 50 MG tablet   Oral   Take 50 mg by mouth every 6 (six) hours as needed for  moderate pain or severe pain.           Allergies Bisphosphonates; Other; Penicillins; Quinolones; and Statins  No family history on file.  Social History Social History  Substance Use Topics  . Smoking status: Unknown If Ever Smoked  . Smokeless tobacco: None  . Alcohol Use: No    Review of Systems  Constitutional: Negative for fever. Eyes: Negative for visual changes. ENT: Negative for sore throat. Cardiovascular: Negative for chest pain. Respiratory: Negative for shortness of breath. Gastrointestinal: Negative for abdominal pain, vomiting or diarrhea. Genitourinary: Negative for dysuria. Musculoskeletal: Negative for back pain. Skin: Negative for rash. + multiple well healing bruises Neurological: Negative for headaches, weakness or numbness.  ____________________________________________   PHYSICAL EXAM:  VITAL SIGNS: ED Triage Vitals  Enc Vitals Group     BP 07/24/15 0814 179/89 mmHg     Pulse Rate 07/24/15 0814 73     Resp 07/24/15 0814 14     Temp 07/24/15 0814 98.1 F (36.7 C)     Temp Source 07/24/15 0814 Oral     SpO2 07/24/15 0814 100 %     Weight --      Height --      Head Cir --      Peak Flow --      Pain Score --      Pain Loc --      Pain Edu? --      Excl. in GC? --     Constitutional: Alert and oriented x 2. Well appearing and in no apparent distress. HEENT:      Head: Normocephalic and atraumatic. Well healing bruises to the face        Eyes: Conjunctivae are normal. Sclera is non-icteric. EOMI. PERRL      Mouth/Throat: Mucous membranes are moist.       Neck: Supple with no signs of meningismus. Cardiovascular: Regular rate and rhythm. No murmurs, gallops, or rubs. 2+ symmetrical distal pulses are present in all extremities. No JVD. Respiratory: Normal respiratory effort.  Lungs are clear to auscultation bilaterally. No wheezes, crackles, or rhonchi.  Gastrointestinal: Soft, non tender, and non distended with positive bowel sounds. No  rebound or guarding. Genitourinary: No suprapubic tenderness. No CVA tenderness. Musculoskeletal: Swelling and tpp over the L shoulder. Nontender with normal range of motion in all other extremities. No edema, cyanosis, or erythema of extremities. No CT and L-spine tenderness, full painless range of motion of bilateral hips. Neurologic: Normal speech and language. Face is symmetric. Moving all extremities. No gross focal neurologic deficits are appreciated. Skin: Skin is warm, dry and intact. No rash noted. Psychiatric: Mood and affect are normal. Speech and behavior are normal.  ____________________________________________   LABS (all labs ordered are listed, but only abnormal results are displayed)  Labs Reviewed  URINALYSIS COMPLETEWITH MICROSCOPIC ([facility] ONLY) - Abnormal; Notable for the following:    Color, Urine YELLOW (*)    APPearance CLEAR (*)    Bacteria, UA RARE (*)    Squamous Epithelial / LPF 0-5 (*)    All other components within normal limits  BASIC METABOLIC PANEL - Abnormal; Notable for the following:    Sodium 146 (*)    Potassium 3.3 (*)    CO2 33 (*)    Glucose, Bld 106 (*)    All other components within normal limits  CK - Abnormal; Notable for the following:    Total CK 450 (*)    All other components within normal limits  CBC WITH DIFFERENTIAL/PLATELET  TROPONIN I   ____________________________________________  EKG  ED ECG REPORT I, Nita Sickle, the attending physician, personally viewed and interpreted this ECG.  Date: 07/24/2015 EKG Time: 8:28 AM  Rate: 71 Rhythm: normal sinus rhythm QRS Axis: LAD Intervals: incomplete RBBB ST/T Wave abnormalities: normal Conduction Disturbances: none   ____________________________________________  RADIOLOGY  reviewed ____________________________________________   PROCEDURES  Procedure(s) performed: None Critical Care performed:  None ____________________________________________   INITIAL  IMPRESSION / ASSESSMENT AND PLAN / ED COURSE  82 year old female with a history of Alzheimer's and multiple falls who presents for evaluation of unwitnessed fall. Patient is alert and oriented 2 and currently at her baseline, she has no complaints, she has multiple well healing old bruises, exams otherwise reassuring. Plan for head CT, CT cervical spine, EKG, troponin and labs as fall was unwitnessed to rule out syncope. We'll also check urinalysis. We'll give her a gram of Tylenol for pain. Watch patient for cardiac monitoring.  Pertinent labs & imaging results that were available during my care of the patient were reviewed by me and considered in my medical decision making (see chart for details).  ----------------------------------------- 10:39 AM on 07/24/2015 -----------------------------------------  Head CT and CT cervical spine unremarkable. Patient remains well appearing. Labs otherwise reassuring. Patient mildly elevated CK with normal kidney function, making urine here. Received a liter of normal saline. We'll discharge back home to her facility. Patient already has a walker at home.  ____________________________________________   FINAL CLINICAL IMPRESSION(S) / ED DIAGNOSES  Final diagnoses:  Fall, initial encounter      NEW MEDICATIONS STARTED DURING THIS VISIT:  New Prescriptions   No medications on file     Note:  This document was prepared using Dragon voice recognition software and may include unintentional dictation errors.    Nita Sickle, MD 07/24/15 1040

## 2015-07-24 NOTE — ED Notes (Signed)
Pt to ED via EMS from [facility] for unwitnessed fall. Pt was found in floor by staff alert. Pt has hx of dementia. Pt has hx of falls adn currently has fracture to LFT shoulder from previous falls along with abrasions.

## 2015-07-24 NOTE — Discharge Instructions (Signed)
Follow-up with her doctor in 2 days. Return to the emergency department for headaches, chest pain, shortness breath, abdominal pain, or any new symptoms concerning to you. Patient to use her walker at home.

## 2015-07-30 ENCOUNTER — Emergency Department: Payer: Medicare HMO

## 2015-07-30 ENCOUNTER — Emergency Department
Admission: EM | Admit: 2015-07-30 | Discharge: 2015-07-30 | Disposition: A | Discharge status: Home / Self Care | Payer: Medicare HMO | Attending: Emergency Medicine | Admitting: Emergency Medicine

## 2015-07-30 DIAGNOSIS — Y999 Unspecified external cause status: Secondary | ICD-10-CM | POA: Diagnosis not present

## 2015-07-30 DIAGNOSIS — E785 Hyperlipidemia, unspecified: Secondary | ICD-10-CM | POA: Diagnosis not present

## 2015-07-30 DIAGNOSIS — Z048 Encounter for examination and observation for other specified reasons: Secondary | ICD-10-CM | POA: Insufficient documentation

## 2015-07-30 DIAGNOSIS — W19XXXA Unspecified fall, initial encounter: Secondary | ICD-10-CM | POA: Insufficient documentation

## 2015-07-30 DIAGNOSIS — Z79899 Other long term (current) drug therapy: Secondary | ICD-10-CM | POA: Insufficient documentation

## 2015-07-30 DIAGNOSIS — Y939 Activity, unspecified: Secondary | ICD-10-CM | POA: Diagnosis not present

## 2015-07-30 DIAGNOSIS — Z7982 Long term (current) use of aspirin: Secondary | ICD-10-CM | POA: Diagnosis not present

## 2015-07-30 DIAGNOSIS — Z792 Long term (current) use of antibiotics: Secondary | ICD-10-CM | POA: Insufficient documentation

## 2015-07-30 DIAGNOSIS — M069 Rheumatoid arthritis, unspecified: Secondary | ICD-10-CM | POA: Diagnosis not present

## 2015-07-30 DIAGNOSIS — M810 Age-related osteoporosis without current pathological fracture: Secondary | ICD-10-CM | POA: Diagnosis not present

## 2015-07-30 DIAGNOSIS — Y929 Unspecified place or not applicable: Secondary | ICD-10-CM | POA: Diagnosis not present

## 2015-07-30 DIAGNOSIS — I10 Essential (primary) hypertension: Secondary | ICD-10-CM | POA: Insufficient documentation

## 2015-07-30 DIAGNOSIS — G309 Alzheimer's disease, unspecified: Secondary | ICD-10-CM | POA: Insufficient documentation

## 2015-07-30 NOTE — ED Notes (Signed)
Called son, he lives in [place]. Calling right now for an ambulance to transport pt back.

## 2015-07-30 NOTE — Discharge Instructions (Signed)
You have been seen in the Emergency Department (ED) today for a fall.  Your work up does not show any concerning injuries.   ? ?Please follow up with your doctor regarding today's Emergency Department (ED) visit and your recent fall.   ? ?Return to the ED if you have any headache, confusion, slurred speech, weakness/numbness of any arm or leg, or any increased pain. ? ?

## 2015-07-30 NOTE — ED Notes (Signed)
Pt presents to ED via [facility] from [facility] for an unwitnessed fall. Pt has fallen previously and is wearing a shoulder immobilizer on L side, dx with L arm fracture. Pt has bruising to L arm from that fall. This fall pt states she fell on R arm/wrist, bruising noted to R wrist. Pt c/o of neck and back pain with EMS, they found her laying on her back in her doorway. Pt only c/o of R wrist pain to this RN at this time. No distress noted, no extra effort in breathing. Skin warm and dry. Pt has hx of dementia. VS with EMS 150/90, 86 HR, 95% RA.

## 2015-07-30 NOTE — ED Provider Notes (Addendum)
[facility] Emergency Department Provider Note  ____________________________________________  Time seen: Approximately 7:50 PM  I have reviewed the triage vital signs and the nursing notes.   HISTORY  Chief Complaint Fall  EM caveat: Limited due to dementia  HPI KEEGHAN MCINTIRE is a 82 y.o. female the history of dementia, advanced, hypertension, rheumatoid arthritis.  Discussed with the patient's son who is healthcare power of attorney and they did report that today she wheeled herself back to her room in her wheelchair, and she is not supposed to but evidently attempted to transfer herself and fell. There was no obvious injury reported. The patient reports she is not having any pain or discomfort except her left arm which she reports has been having a problem. Her power of attorney reports that she fractured her left arm due to a fall, she also has a chronic injury of the right wrist which is sometimes sore. The patient does note his regular through each extremity that she does have some tenderness in the right wrist.  No headache. No neck pain. The patient denies any severe concerning pain. She does report that she thinks she fell.  It is noted discussed with her son that she had a recent urinary tract infection, he reports that there has not been any mention of any fever abdominal pain nausea vomiting or other symptoms to suggest that that has recurred.   Past Medical History  Diagnosis Date  . Hypertension   . Alzheimer disease   . Rheumatoid arthritis (HCC)   . Hyperlipidemia   . Osteoporosis   . Rheumatoid arthritis (HCC)   . Psoriasis     There are no active problems to display for this patient.   History reviewed. No pertinent past surgical history.  Current Outpatient Rx  Name  Route  Sig  Dispense  Refill  . acetaminophen (TYLENOL) 500 MG tablet   Oral   Take 500 mg by mouth every 4 (four) hours as needed for mild pain, fever or  headache. *Do not exceed 2000 mg per day*         . acetaminophen (TYLENOL) 650 MG CR tablet   Oral   Take 650 mg by mouth every 8 (eight) hours.         Marland Kitchen alum & mag hydroxide-simeth (MAALOX PLUS) 400-400-40 MG/5ML suspension   Oral   Take 30 mLs by mouth See admin instructions. Take 30 ml by mouth up to 4 times a day as needed for heartburn/indigestion. *Not to exceed 4 doses in 24 hours*         . aspirin 81 MG chewable tablet   Oral   Chew 81 mg by mouth daily.         Marland Kitchen azelastine (ASTELIN) 0.1 % nasal spray   Each Nare   Place 1 spray into both nostrils 2 (two) times daily as needed for rhinitis. Use in each nostril as directed         . calcium carbonate (OS-CAL) 600 MG TABS tablet   Oral   Take 600 mg by mouth daily. Take at noon.         . cephALEXin (KEFLEX) 500 MG capsule   Oral   Take 1 capsule (500 mg total) by mouth 2 (two) times daily.   14 capsule   0   . Cholecalciferol (EQL VITAMIN D3) 2000 units CAPS   Oral   Take 2,000 Units by mouth daily.         Marland Kitchen  donepezil (ARICEPT) 10 MG tablet   Oral   Take 20 mg by mouth at bedtime. *Note dose*         . folic acid (FOLVITE) 1 MG tablet   Oral   Take 1 mg by mouth daily.         Marland Kitchen guaiFENesin (MUCINEX) 600 MG 12 hr tablet   Oral   Take 600 mg by mouth 2 (two) times daily as needed for cough or to loosen phlegm.         Marland Kitchen guaifenesin (ROBAFEN) 100 MG/5ML syrup   Oral   Take 200 mg by mouth every 6 (six) hours as needed for cough. *Not to exceed 4 doses in 24 hours*         . loperamide (IMODIUM) 2 MG capsule   Oral   Take 2 mg by mouth as needed for diarrhea or loose stools. *Do not exceed 8 doses in 24 hrs*         . magnesium hydroxide (MILK OF MAGNESIA) 400 MG/5ML suspension   Oral   Take 30 mLs by mouth at bedtime as needed for mild constipation or moderate constipation.         . mirtazapine (REMERON) 7.5 MG tablet   Oral   Take 7.5 mg by mouth daily.         Marland Kitchen  Neomycin-Bacitracin-Polymyxin (TRIPLE ANTIBIOTIC) [phone] OINT   Apply externally   Apply 1 application topically as needed.         . ondansetron (ZOFRAN-ODT) 4 MG disintegrating tablet   Oral   Take 4 mg by mouth every 6 (six) hours as needed for nausea or vomiting.         . pantoprazole (PROTONIX) 40 MG tablet   Oral   Take 40 mg by mouth daily.         . traMADol (ULTRAM) 50 MG tablet   Oral   Take 50 mg by mouth every 6 (six) hours as needed for moderate pain or severe pain.           Allergies Bisphosphonates; Other; Penicillins; Quinolones; and Statins  History reviewed. No pertinent family history.  Social History Social History  Substance Use Topics  . Smoking status: Unknown If Ever Smoked  . Smokeless tobacco: None  . Alcohol Use: No    Review of Systems EM caveat. The patient does deny chest pain or trouble breathing. She denies any pain in her arms or legs except her left shoulder has been sore since falling previous. ____________________________________________   PHYSICAL EXAM:  VITAL SIGNS: ED Triage Vitals  Enc Vitals Group     BP 07/30/15 1941 152/72 mmHg     Pulse Rate 07/30/15 1941 87     Resp 07/30/15 1941 18     Temp 07/30/15 1941 98.6 F (37 C)     Temp Source 07/30/15 1941 Oral     SpO2 07/30/15 1941 100 %     Weight 07/30/15 1941 124 lb 11.2 oz (56.564 kg)     Height --      Head Cir --      Peak Flow --      Pain Score --      Pain Loc --      Pain Edu? --      Excl. in GC? --    Constitutional: Alert and orientedTo self and recognizes her son's name but does not know the date time or really seem to recall many events from  today. Well appearing and in no acute distress. Well-groomed. Very pleasant. Eyes: Conjunctivae are normal. PERRL. EOMI. Head: Atraumatic. Nose: No congestion/rhinnorhea. Mouth/Throat: Mucous membranes are moist.  Oropharynx non-erythematous. Neck: No stridor.  No cervical spine  tenderness Cardiovascular: Normal rate, regular rhythm. Grossly normal heart sounds.  Good peripheral circulation. Respiratory: Normal respiratory effort.  No retractions. Lungs CTAB. Gastrointestinal: Soft and nontender. No distention. Musculoskeletal: Right upper extremity full range of motion in all major joints of there is some tenderness and slight deformity overlying the right wrist, however evidently has an old fracture here. Patient denies any numbness tingling or weakness in hand. Left upper extremity is immobilized, has bruising over the left upper extremity/humerus. Good use of the left hand. No pain or tenderness the left hand left wrist left forearm,. Good range of motion of both hips, no tenderness to lower extremities, no lower extremity tenderness nor edema.  No joint effusions. Neurologic:  Normal speech and language. No gross focal neurologic deficits are appreciated.Skin:  Skin is warm, dry and intact. No rash noted. Psychiatric: Mood and affect are normal.   ____________________________________________   LABS (all labs ordered are listed, but only abnormal results are displayed)  Labs Reviewed - No data to display ____________________________________________  EKG   ____________________________________________  RADIOLOGY   DG Wrist Complete Right (Final result) Result time: 07/30/15 20:12:10   Final result by Rad Results In Interface (07/30/15 20:12:10)   Narrative:   CLINICAL DATA: Status post fall with pain in the right wrist.  EXAM: RIGHT WRIST - COMPLETE 3+ VIEW  COMPARISON: September 09, 2013  FINDINGS: There is no evidence of fracture or dislocation. There is diffuse osteopenia. There are chronic deformities of the distal radius. Severe degenerative joint changes are identified throughout the carpal bones stable compared to prior exam.  IMPRESSION: No acute fracture or dislocation identified.  Severe degenerative joint changes of the carpal bones  unchanged compared prior exam.  Chronic deformity of distal radius.   Electronically Signed By: Sherian Rein M.D. On: 07/30/2015 20:12     ____________________________________________   PROCEDURES  Procedure(s) performed: None  Critical Care performed: No  ____________________________________________   INITIAL IMPRESSION / ASSESSMENT AND PLAN / ED COURSE  Pertinent labs & imaging results that were available during my care of the patient were reviewed by me and considered in my medical decision making (see chart for details).  Discussed with the patient's son who is healthcare power of attorney. He denied previous discussion and he reaffirms that he would not wish for his mother to have any CAT scans, blood work, or x-rays unless definitely needed. The reports that he would definitely not want her to have a CT of the head or neck. I think this is very reasonable, and based on my previous discussion with him is consistent with her goals of care. She has no evidence suggest major injury, significant trauma, and is not on any anticoagulant other than aspirin. No evidence of head injury, neck pain or discomfort.  I will get an x-ray of her right wrist as well as slight deformity, however in discussion with the patient's son I suspect this is likely chronic as well.  Patient's son reports a goal of care would be to refer her back to [facility] to continue her ongoing care and maintain quality of life as best possible. I think this is reasonable.  ----------------------------------------- 8:32 PM on 07/30/2015 -----------------------------------------  Patient resting comfortably. Patient will be discharged back to [facility], her care facility.  ____________________________________________   FINAL CLINICAL IMPRESSION(S) / ED DIAGNOSES  Final diagnoses:  Fall, initial encounter      Sharyn Creamer, MD 07/30/15  2032  ----------------------------------------- 9:08 PM on 07/30/2015 -----------------------------------------  Patient resting comfortably. Ongoing care assigned to Dr. Lenard Lance, present plan is the patient is awaiting transport back to  house.  Sharyn Creamer, MD 07/30/15 2109

## 2015-07-30 NOTE — ED Notes (Signed)
Pt denies hitting head or LOC to this RN. Dr. Fanny Bien at bedside and pt c/o of headache, R leg pain, R wrist pain.

## 2015-09-05 ENCOUNTER — Emergency Department

## 2015-09-05 ENCOUNTER — Emergency Department
Admission: EM | Admit: 2015-09-05 | Discharge: 2015-09-05 | Disposition: A | Discharge status: Home / Self Care | Attending: Emergency Medicine | Admitting: Emergency Medicine

## 2015-09-05 DIAGNOSIS — Y929 Unspecified place or not applicable: Secondary | ICD-10-CM | POA: Diagnosis not present

## 2015-09-05 DIAGNOSIS — Y939 Activity, unspecified: Secondary | ICD-10-CM | POA: Diagnosis not present

## 2015-09-05 DIAGNOSIS — I10 Essential (primary) hypertension: Secondary | ICD-10-CM | POA: Insufficient documentation

## 2015-09-05 DIAGNOSIS — T148 Other injury of unspecified body region: Secondary | ICD-10-CM | POA: Diagnosis not present

## 2015-09-05 DIAGNOSIS — Z79899 Other long term (current) drug therapy: Secondary | ICD-10-CM | POA: Diagnosis not present

## 2015-09-05 DIAGNOSIS — Y999 Unspecified external cause status: Secondary | ICD-10-CM | POA: Diagnosis not present

## 2015-09-05 DIAGNOSIS — G309 Alzheimer's disease, unspecified: Secondary | ICD-10-CM | POA: Diagnosis not present

## 2015-09-05 DIAGNOSIS — M545 Low back pain: Secondary | ICD-10-CM | POA: Diagnosis present

## 2015-09-05 DIAGNOSIS — W050XXA Fall from non-moving wheelchair, initial encounter: Secondary | ICD-10-CM | POA: Diagnosis not present

## 2015-09-05 DIAGNOSIS — Z7982 Long term (current) use of aspirin: Secondary | ICD-10-CM | POA: Diagnosis not present

## 2015-09-05 DIAGNOSIS — W19XXXA Unspecified fall, initial encounter: Secondary | ICD-10-CM

## 2015-09-05 DIAGNOSIS — T148XXA Other injury of unspecified body region, initial encounter: Secondary | ICD-10-CM

## 2015-09-05 NOTE — ED Provider Notes (Signed)
[facility] Emergency Department Provider Note  ____________________________________________  Time seen: Approximately 8:04 PM  I have reviewed the triage vital signs and the nursing notes.   HISTORY  Chief Complaint Fall  Level 5 caveat:  Portions of the history and physical were unable to be obtained due to the patient's chronic dementia   HPI Amanda Watkins is a 82 y.o. female who fell out of her wheelchair onto the right side of her back earlier today. She complains of some right lower back pain. No pain in her legs or hips. No chest pain shortness of breath. She fell because she just lost her balance. She denies any dizziness or pain or other preceding symptoms.     Past Medical History:  Diagnosis Date  . Alzheimer disease   . Hyperlipidemia   . Hypertension   . Osteoporosis   . Psoriasis   . Rheumatoid arthritis (HCC)   . Rheumatoid arthritis (HCC)      There are no active problems to display for this patient.    History reviewed. No pertinent surgical history.   Prior to Admission medications   Medication Sig Start Date End Date Taking? Authorizing Provider  acetaminophen (TYLENOL) 500 MG tablet Take 500 mg by mouth every 4 (four) hours as needed for mild pain, fever or headache. *Do not exceed 2000 mg per day*    Historical Provider, MD  acetaminophen (TYLENOL) 650 MG CR tablet Take 650 mg by mouth every 8 (eight) hours.    Historical Provider, MD  alum & mag hydroxide-simeth (MAALOX PLUS) 400-400-40 MG/5ML suspension Take 30 mLs by mouth See admin instructions. Take 30 ml by mouth up to 4 times a day as needed for heartburn/indigestion. *Not to exceed 4 doses in 24 hours*    Historical Provider, MD  aspirin 81 MG chewable tablet Chew 81 mg by mouth daily.    Historical Provider, MD  azelastine (ASTELIN) 0.1 % nasal spray Place 1 spray into both nostrils 2 (two) times daily as needed for rhinitis. Use in each nostril as directed     Historical Provider, MD  calcium carbonate (OS-CAL) 600 MG TABS tablet Take 600 mg by mouth daily. Take at noon.    Historical Provider, MD  cephALEXin (KEFLEX) 500 MG capsule Take 1 capsule (500 mg total) by mouth 2 (two) times daily. 07/18/15   Sharman Cheek, MD  Cholecalciferol (EQL VITAMIN D3) 2000 units CAPS Take 2,000 Units by mouth daily.    Historical Provider, MD  donepezil (ARICEPT) 10 MG tablet Take 20 mg by mouth at bedtime. *Note dose*    Historical Provider, MD  folic acid (FOLVITE) 1 MG tablet Take 1 mg by mouth daily.    Historical Provider, MD  guaiFENesin (MUCINEX) 600 MG 12 hr tablet Take 600 mg by mouth 2 (two) times daily as needed for cough or to loosen phlegm.    Historical Provider, MD  guaifenesin (ROBAFEN) 100 MG/5ML syrup Take 200 mg by mouth every 6 (six) hours as needed for cough. *Not to exceed 4 doses in 24 hours*    Historical Provider, MD  loperamide (IMODIUM) 2 MG capsule Take 2 mg by mouth as needed for diarrhea or loose stools. *Do not exceed 8 doses in 24 hrs*    Historical Provider, MD  magnesium hydroxide (MILK OF MAGNESIA) 400 MG/5ML suspension Take 30 mLs by mouth at bedtime as needed for mild constipation or moderate constipation.    Historical Provider, MD  mirtazapine (REMERON) 7.5 MG  tablet Take 7.5 mg by mouth daily.    Historical Provider, MD  Neomycin-Bacitracin-Polymyxin (TRIPLE ANTIBIOTIC) [phone] OINT Apply 1 application topically as needed.    Historical Provider, MD  ondansetron (ZOFRAN-ODT) 4 MG disintegrating tablet Take 4 mg by mouth every 6 (six) hours as needed for nausea or vomiting.    Historical Provider, MD  pantoprazole (PROTONIX) 40 MG tablet Take 40 mg by mouth daily.    Historical Provider, MD  traMADol (ULTRAM) 50 MG tablet Take 50 mg by mouth every 6 (six) hours as needed for moderate pain or severe pain.    Historical Provider, MD     Allergies Bisphosphonates; Other; Penicillins; Quinolones; and Statins   No family  history on file.  Social History Social History  Substance Use Topics  . Smoking status: Unknown If Ever Smoked  . Smokeless tobacco: Not on file  . Alcohol use No    Review of Systems   Cardiovascular:   No chest pain. Respiratory:   No dyspnea or cough. Gastrointestinal:   Negative for abdominal pain, vomiting and diarrhea.  Musculoskeletal:   Negative for focal pain or swelling Neurological:   Negative for headaches or head injury 10-point ROS otherwise negative.  ____________________________________________   PHYSICAL EXAM:  VITAL SIGNS: ED Triage Vitals  Enc Vitals Group     BP 09/05/15 1959 (!) 152/119     Pulse Rate 09/05/15 1959 82     Resp 09/05/15 1959 16     Temp 09/05/15 1959 98.6 F (37 C)     Temp Source 09/05/15 1959 Oral     SpO2 09/05/15 1959 100 %     Weight 09/05/15 2000 124 lb (56.2 kg)     Height --      Head Circumference --      Peak Flow --      Pain Score --      Pain Loc --      Pain Edu? --      Excl. in GC? --     Vital signs reviewed, nursing assessments reviewed.   Constitutional:   Alert and orientedTo person and place. Well appearing and in no distress. Eyes:   No scleral icterus. No conjunctival pallor. PERRL. EOMI.  No nystagmus. ENT   Head:   Normocephalic and atraumatic.   Nose:   No congestion/rhinnorhea. No septal hematoma   Mouth/Throat:   MMM, no pharyngeal erythema. No peritonsillar mass.    Neck:   No stridor. No SubQ emphysema. No meningismus. Hematological/Lymphatic/Immunilogical:   No cervical lymphadenopathy. Cardiovascular:   RRR. Symmetric bilateral radial and DP pulses.  No murmurs.  Respiratory:   Normal respiratory effort without tachypnea nor retractions. Breath sounds are clear and equal bilaterally. No wheezes/rales/rhonchi. Gastrointestinal:   Soft and nontender. Non distended. There is no CVA tenderness.  No rebound, rigidity, or guarding. Genitourinary:   deferred Musculoskeletal:    Nontender with normal range of motion in all extremities. No joint effusions.  No lower extremity tenderness.  No edema. No focal midline spinal tenderness. There is some tenderness in the soft tissues of the right lower back. Neurologic:   Normal speech and language.  CN 2-10 normal. Motor grossly intact. No gross focal neurologic deficits are appreciated.  Skin:    Skin is warm, dry and intact. No rash noted.  No petechiae, purpura, or bullae. No ecchymosis  ____________________________________________    LABS (pertinent positives/negatives) (all labs ordered are listed, but only abnormal results are displayed) Labs Reviewed - No  data to display ____________________________________________   EKG    ____________________________________________    RADIOLOGY  X-ray lumbar spine unremarkable  ____________________________________________   PROCEDURES Procedures  ____________________________________________   INITIAL IMPRESSION / ASSESSMENT AND PLAN / ED COURSE  Pertinent labs & imaging results that were available during my care of the patient were reviewed by me and considered in my medical decision making (see chart for details).  Patient well appearing no acute distress. Presents with some right lower back pain and tenderness after a fall from low height from a wheelchair. Exam is reassuring, no obvious injuries. X-ray of lumbar spine is unremarkable. We'll discharge home, follow-up with primary care tomorrow. Does have some elevated blood pressure but not warranting emergent lowering here in the ED. We'll have her follow up with primary care for further evaluation of her blood pressure.     Clinical Course   ____________________________________________   FINAL CLINICAL IMPRESSION(S) / ED DIAGNOSES  Final diagnoses:  Fall, initial encounter  Contusion       Portions of this note were generated with dragon dictation software. Dictation errors may occur  despite best attempts at proofreading.    Sharman Cheek, MD 09/05/15 2211

## 2015-09-05 NOTE — ED Notes (Signed)
Pt arrived via EMS from [facility].  EMS states that she fell out of wheelchair with no LOC.  Pt pleasantly confused upon arrival, which per EMS is her baseline.  Pt does not appear in pain, but does wince when her back is touched.  Pt has had frequent falls and been seen in this ER for such.  Pt placed on bed alarm and placed in gown for xrays.  EDP declines need for bloodwork or IV at this time.  Per EMS pt's vital signs WNL.

## 2015-09-05 NOTE — ED Notes (Signed)
EDP notified of pt's manual blood pressure reading.  Acknowledged, no orders at this time.

## 2015-11-05 IMAGING — CT CT HEAD WITHOUT CONTRAST
3 of 4 series · 17 of 30 positions shown, 19 images · non-contrast
Comparison: Head and cervical spine CT scan 04/21/2014 and
09/09/2013.

CLINICAL DATA: Patient found down today after a fall out of bed.
Hematoma above the left eye with a laceration. Initial encounter.

EXAM:
CT HEAD WITHOUT CONTRAST
CT CERVICAL SPINE WITHOUT CONTRAST
TECHNIQUE: Multidetector CT imaging of the head and cervical spine was
performed following the standard protocol without intravenous
contrast. Multiplanar CT image reconstructions of the cervical spine
were also generated.

[Series 2: head bone · axial · 0.42mm/px · z∈[-190,-60]mm · 8 of 85 slices shown]
[im 10/85  bone]
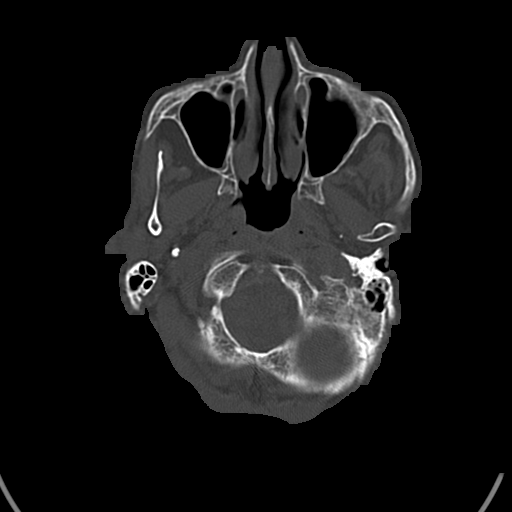
[im 19/85  bone]
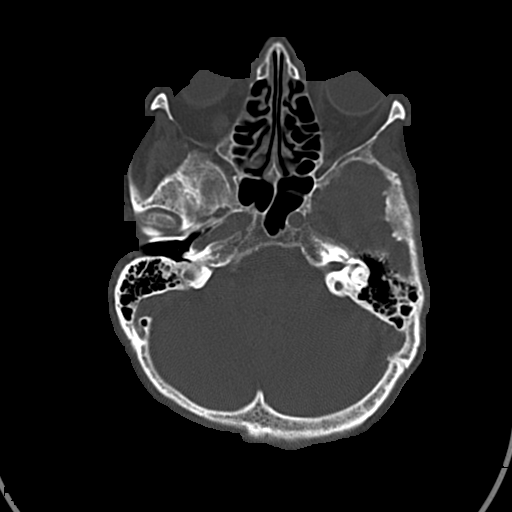
[im 29/85  bone]
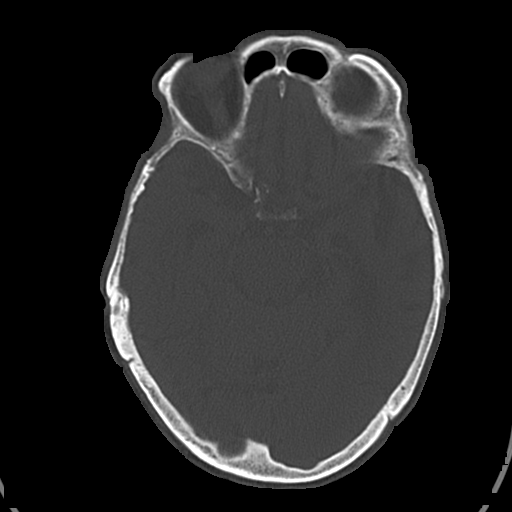
[im 38/85  bone]
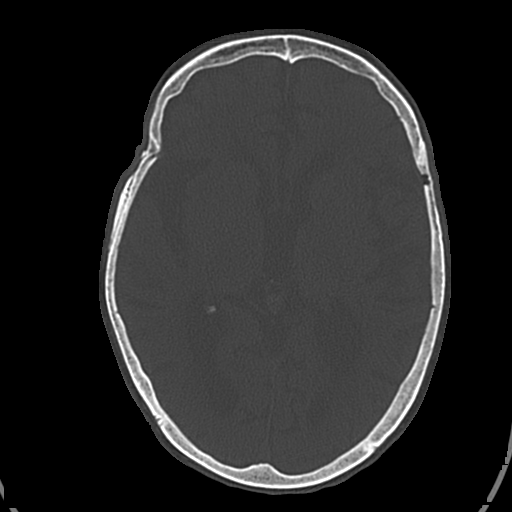
[im 47/85  bone]
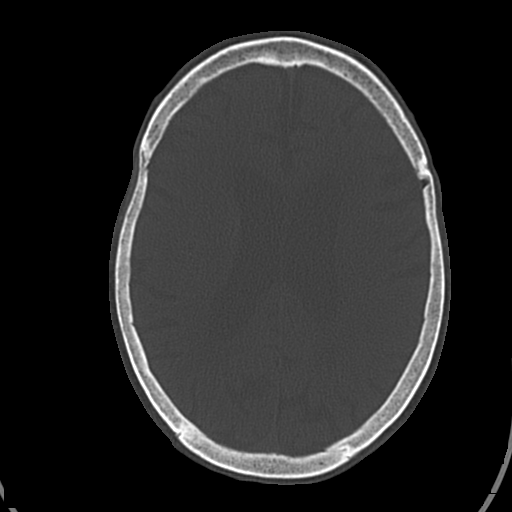
[im 57/85  bone]
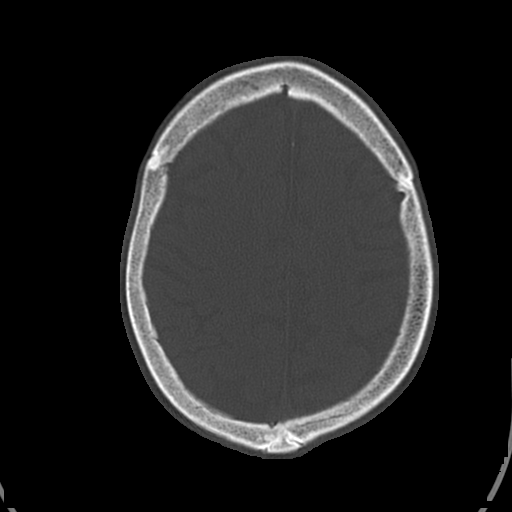
[im 66/85  bone]
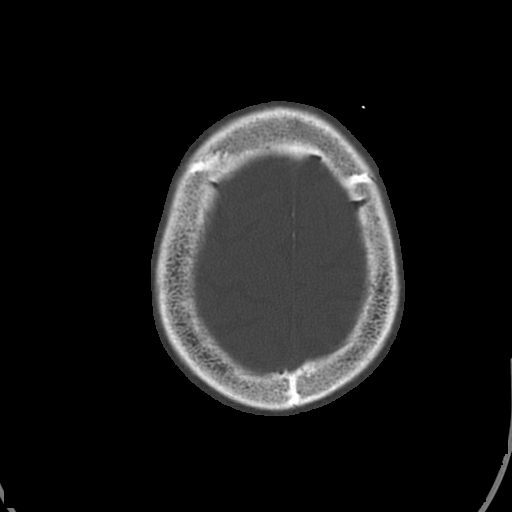
[im 75/85  bone]
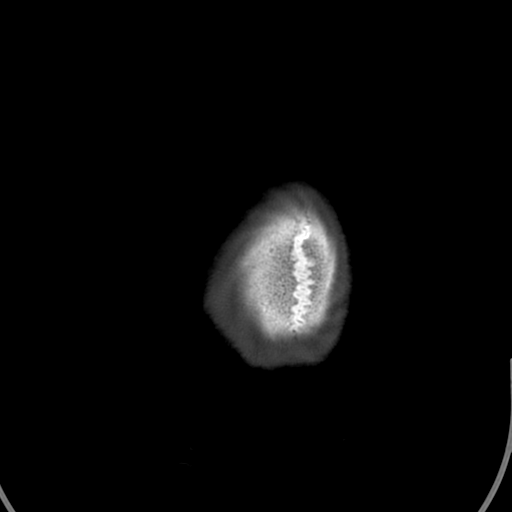

[Series 7: head wo · axial · 0.42mm/px · z∈[-149,-99]mm · 2 of 31 slices shown]
[im 11/31  brain]
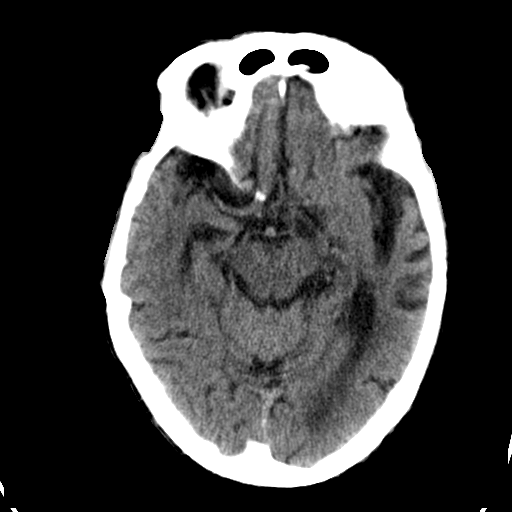
[im 21/31  brain]
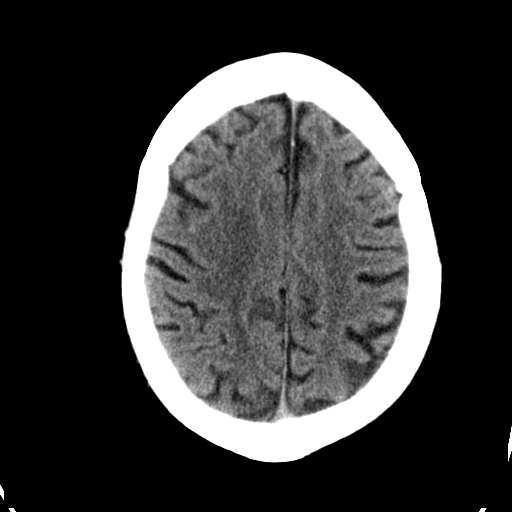

[Series 10: orthogonal axials · axial · 0.25mm/px · z∈[-330,-220]mm · 7 of 81 slices shown, 9 images]
[im 11/81  brain]
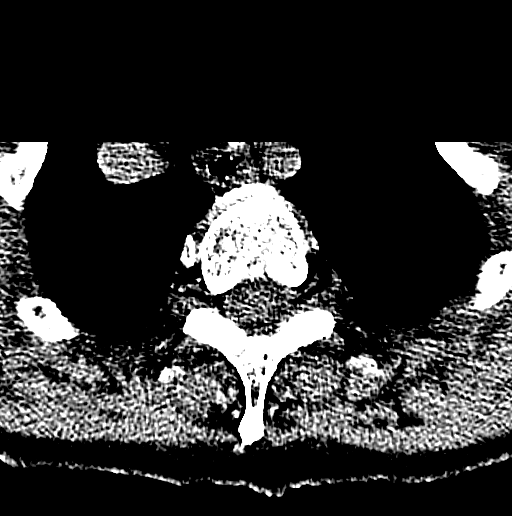
[im 11/81  bone]
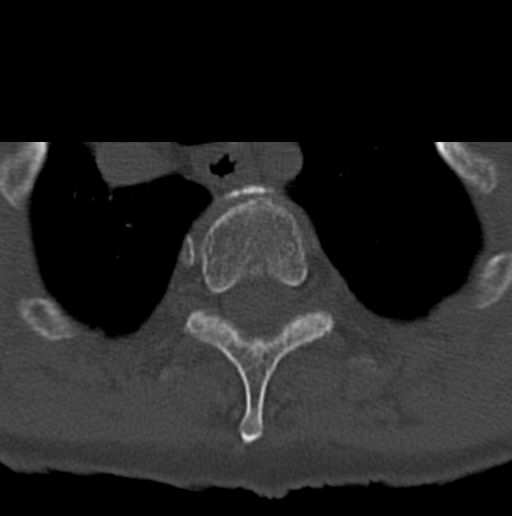
[im 21/81  brain]
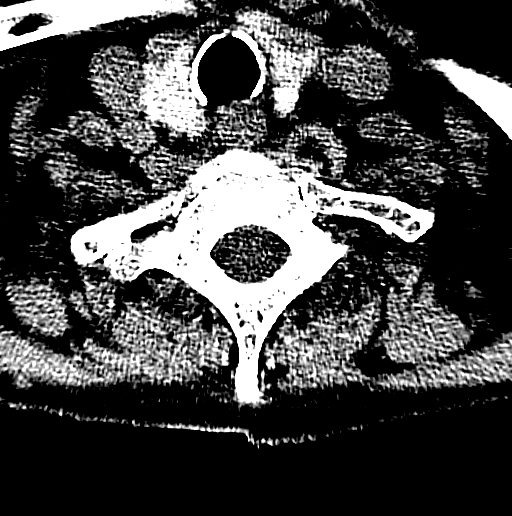
[im 31/81  brain]
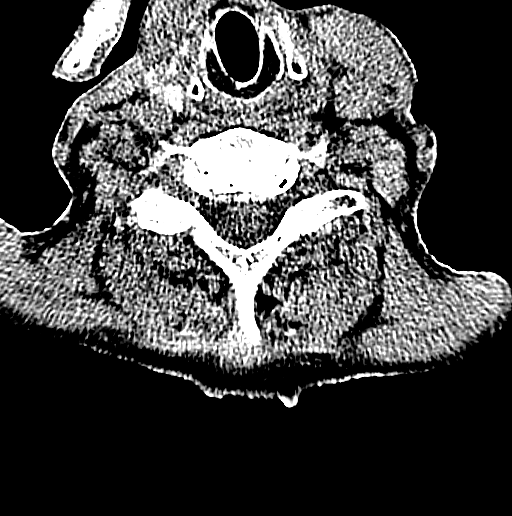
[im 41/81  brain]
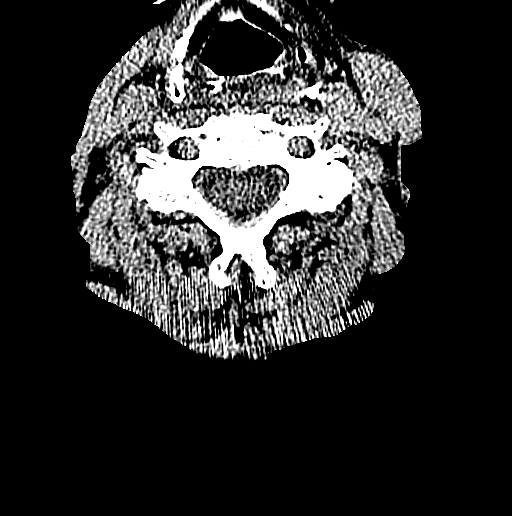
[im 51/81  brain]
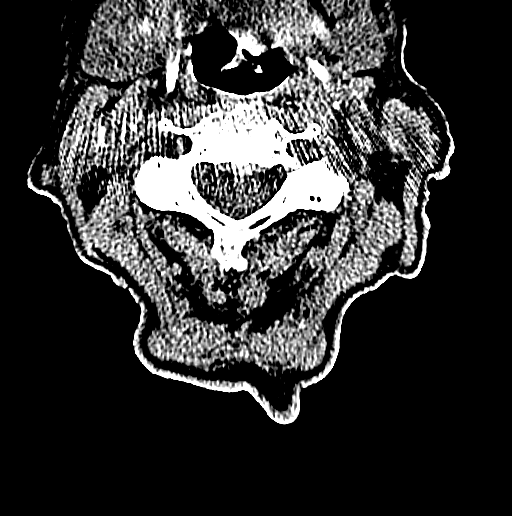
[im 51/81  bone]
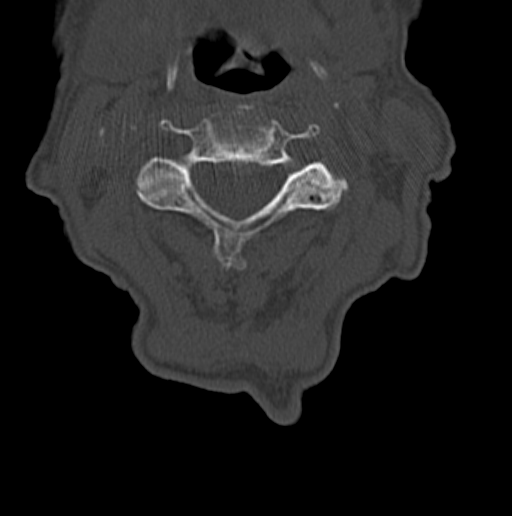
[im 61/81  brain]
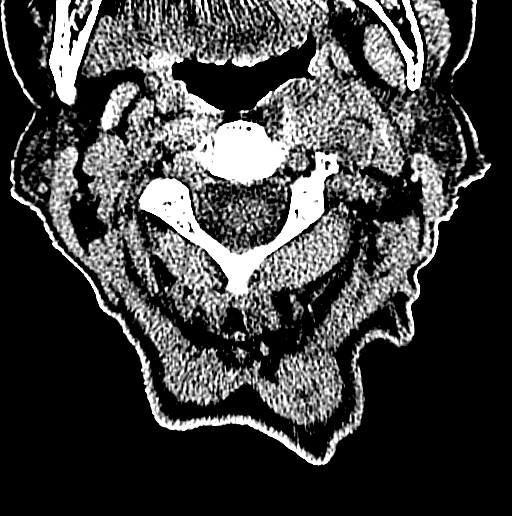
[im 71/81  brain]
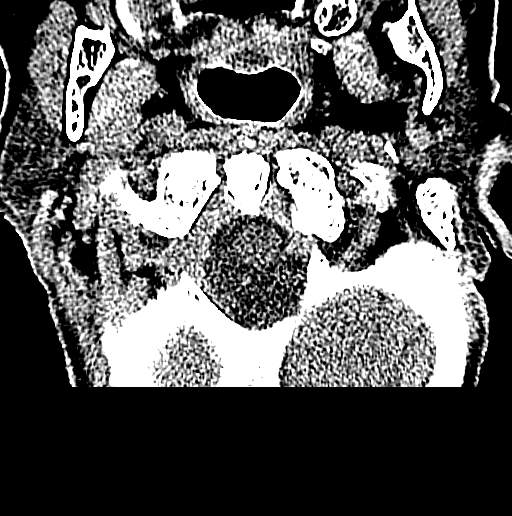

[17 of 30 positions shown; findings below may reference images not displayed]

FINDINGS: CT HEAD FINDINGS

Atrophy and chronic microvascular ischemic change are again seen. No
evidence of acute intracranial abnormality including hemorrhage,
infarct, mass lesion, mass effect, midline shift or abnormal
extra-axial fluid collection is seen. No hydrocephalus or
pneumocephalus. The calvarium is intact. Imaged paranasal sinuses
and mastoid air cells are clear.

CT CERVICAL SPINE FINDINGS

No cervical spine fracture is identified. 0.2 cm anterolisthesis C5
on C6 is due to facet degenerative disease and unchanged.
Paraspinous soft tissue structures are unremarkable. Lung apices are
clear.
IMPRESSION: No acute finding head or cervical spine.

## 2015-11-05 IMAGING — CR DG LUMBAR SPINE 2-3V
1 series · 2 of 2 positions shown · non-contrast
Comparison: January 04, 2014

CLINICAL DATA: Lumbago

EXAM:
LUMBAR SPINE - 2-3 VIEW

[Series 1: dxr lumbar spine ap and lateral · 0.14mm/px · 2 of 2 slices shown]
[im 1/2]
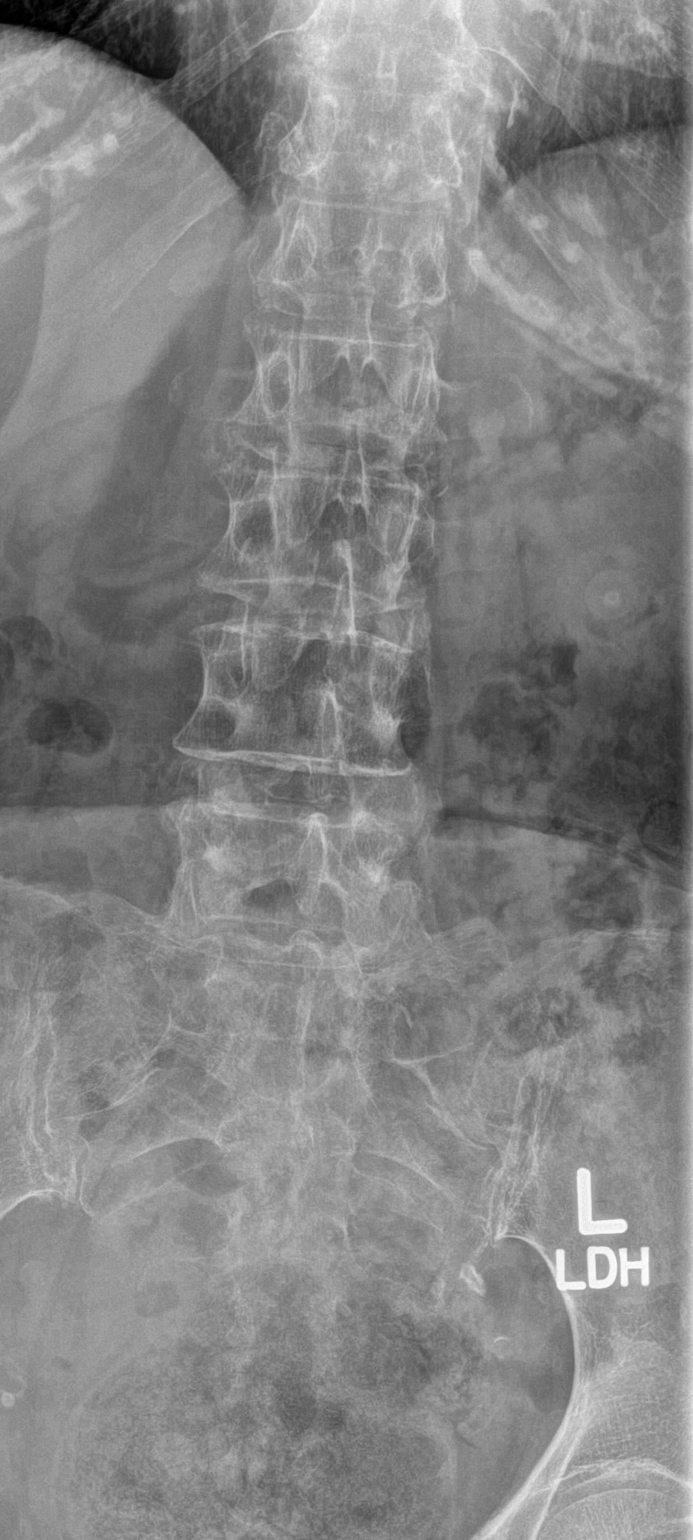
[im 2/2]
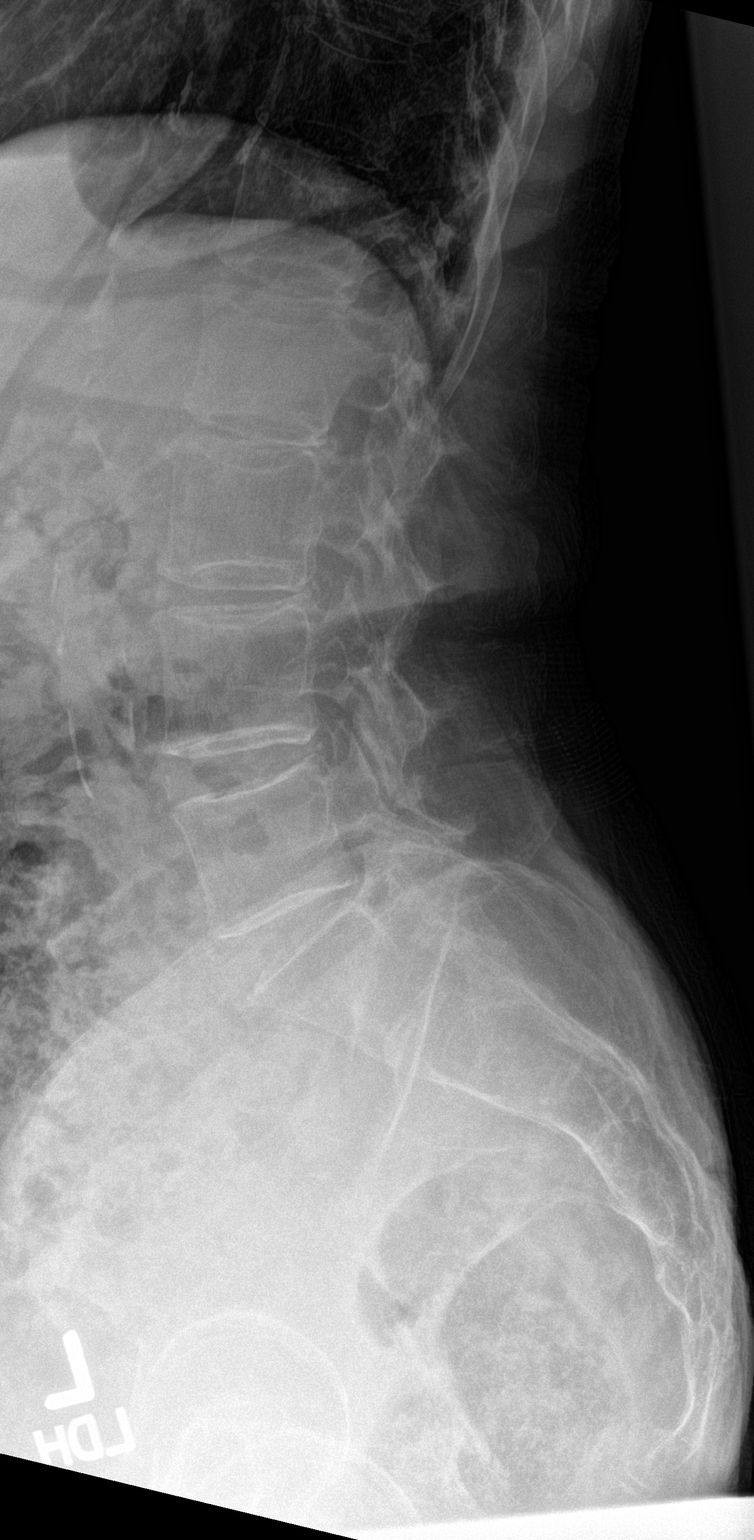

[2 of 2 positions shown; findings below may reference images not displayed]

FINDINGS: Frontal and lateral views were obtained. There are 5 non-rib-bearing
lumbar type vertebral bodies. There is no fracture or
spondylolisthesis. There is mild disc space narrowing at L1-2, L2-3,
and L3-4. No erosive change. There is atherosclerotic change in
aorta.
IMPRESSION: Areas of mild osteoarthritic change. No fracture or
spondylolisthesis.

## 2016-02-20 DEATH — deceased
